# Patient Record
Sex: Male | Born: 1935 | Race: White | Hispanic: No | Marital: Married | Smoking: Never smoker
Health system: Southern US, Community
[De-identification: ages and names within clinical notes are randomized; demographics above are authoritative.]

## PROBLEM LIST (undated history)

## (undated) DIAGNOSIS — E079 Disorder of thyroid, unspecified: Secondary | ICD-10-CM

## (undated) DIAGNOSIS — F32A Depression, unspecified: Secondary | ICD-10-CM

## (undated) DIAGNOSIS — F419 Anxiety disorder, unspecified: Secondary | ICD-10-CM

## (undated) DIAGNOSIS — K219 Gastro-esophageal reflux disease without esophagitis: Secondary | ICD-10-CM

## (undated) DIAGNOSIS — N400 Enlarged prostate without lower urinary tract symptoms: Secondary | ICD-10-CM

## (undated) DIAGNOSIS — J449 Chronic obstructive pulmonary disease, unspecified: Secondary | ICD-10-CM

## (undated) DIAGNOSIS — F329 Major depressive disorder, single episode, unspecified: Secondary | ICD-10-CM

## (undated) HISTORY — DX: Chronic obstructive pulmonary disease, unspecified: J44.9

## (undated) HISTORY — DX: Benign prostatic hyperplasia without lower urinary tract symptoms: N40.0

## (undated) HISTORY — DX: Major depressive disorder, single episode, unspecified: F32.9

## (undated) HISTORY — DX: Disorder of thyroid, unspecified: E07.9

## (undated) HISTORY — PX: CHOLECYSTECTOMY: SHX55

## (undated) HISTORY — DX: Anxiety disorder, unspecified: F41.9

## (undated) HISTORY — DX: Gastro-esophageal reflux disease without esophagitis: K21.9

## (undated) HISTORY — PX: REPLACEMENT TOTAL KNEE: SUR1224

## (undated) HISTORY — DX: Depression, unspecified: F32.A

## (undated) HISTORY — PX: SHOULDER SURGERY: SHX246

---

## 2004-10-07 ENCOUNTER — Ambulatory Visit: Payer: Self-pay | Admitting: Family Medicine

## 2006-02-10 ENCOUNTER — Other Ambulatory Visit: Payer: Self-pay

## 2006-02-10 ENCOUNTER — Emergency Department: Payer: Self-pay | Admitting: Emergency Medicine

## 2006-02-20 ENCOUNTER — Ambulatory Visit: Payer: Self-pay | Admitting: Family Medicine

## 2006-04-10 ENCOUNTER — Inpatient Hospital Stay: Payer: Self-pay | Admitting: General Practice

## 2006-05-02 ENCOUNTER — Encounter: Payer: Self-pay | Admitting: General Practice

## 2006-12-31 ENCOUNTER — Encounter: Payer: Self-pay | Admitting: General Practice

## 2007-01-21 ENCOUNTER — Encounter: Payer: Self-pay | Admitting: General Practice

## 2009-08-17 ENCOUNTER — Ambulatory Visit: Payer: Self-pay | Admitting: General Practice

## 2009-08-25 ENCOUNTER — Ambulatory Visit: Payer: Self-pay | Admitting: Family Medicine

## 2009-08-30 ENCOUNTER — Inpatient Hospital Stay: Payer: Self-pay | Admitting: General Practice

## 2009-09-03 ENCOUNTER — Encounter: Payer: Self-pay | Admitting: Internal Medicine

## 2010-11-30 ENCOUNTER — Ambulatory Visit: Payer: Self-pay | Admitting: Family Medicine

## 2010-12-23 ENCOUNTER — Ambulatory Visit: Payer: Self-pay | Admitting: Internal Medicine

## 2011-12-07 ENCOUNTER — Ambulatory Visit: Payer: Self-pay | Admitting: Family Medicine

## 2013-05-12 ENCOUNTER — Emergency Department: Payer: Self-pay | Admitting: Emergency Medicine

## 2013-05-12 LAB — COMPREHENSIVE METABOLIC PANEL
Albumin: 3.7 g/dL (ref 3.4–5.0)
Alkaline Phosphatase: 51 U/L
Anion Gap: 6 — ABNORMAL LOW (ref 7–16)
BUN: 9 mg/dL (ref 7–18)
Bilirubin,Total: 0.6 mg/dL (ref 0.2–1.0)
Creatinine: 0.8 mg/dL (ref 0.60–1.30)
EGFR (Non-African Amer.): >60
Glucose: 119 mg/dL — ABNORMAL HIGH (ref 65–99)
Osmolality: 255 (ref 275–301)
SGPT (ALT): 25 U/L (ref 12–78)
Sodium: 127 mmol/L — ABNORMAL LOW (ref 136–145)

## 2013-05-12 LAB — CBC
HCT: 36.5 % — ABNORMAL LOW (ref 40.0–52.0)
HGB: 12.1 g/dL — ABNORMAL LOW (ref 13.0–18.0)
MCH: 30.6 pg (ref 26.0–34.0)
MCHC: 33 g/dL (ref 32.0–36.0)
Platelet: 153 10*3/uL (ref 150–440)
RBC: 3.94 10*6/uL — ABNORMAL LOW (ref 4.40–5.90)

## 2013-05-17 LAB — CULTURE, BLOOD (SINGLE)

## 2014-05-22 HISTORY — PX: COLONOSCOPY: SHX5424

## 2014-08-08 ENCOUNTER — Inpatient Hospital Stay: Payer: Self-pay | Admitting: Internal Medicine

## 2014-08-08 DIAGNOSIS — R0682 Tachypnea, not elsewhere classified: Secondary | ICD-10-CM | POA: Diagnosis not present

## 2014-08-08 DIAGNOSIS — R0902 Hypoxemia: Secondary | ICD-10-CM | POA: Diagnosis not present

## 2014-08-08 DIAGNOSIS — K269 Duodenal ulcer, unspecified as acute or chronic, without hemorrhage or perforation: Secondary | ICD-10-CM | POA: Diagnosis not present

## 2014-08-08 DIAGNOSIS — K920 Hematemesis: Secondary | ICD-10-CM | POA: Diagnosis not present

## 2014-08-08 DIAGNOSIS — K30 Functional dyspepsia: Secondary | ICD-10-CM | POA: Diagnosis not present

## 2014-08-08 DIAGNOSIS — J9601 Acute respiratory failure with hypoxia: Secondary | ICD-10-CM | POA: Diagnosis not present

## 2014-08-08 DIAGNOSIS — Z79891 Long term (current) use of opiate analgesic: Secondary | ICD-10-CM | POA: Diagnosis not present

## 2014-08-08 DIAGNOSIS — R4182 Altered mental status, unspecified: Secondary | ICD-10-CM | POA: Diagnosis not present

## 2014-08-08 DIAGNOSIS — Z885 Allergy status to narcotic agent status: Secondary | ICD-10-CM | POA: Diagnosis not present

## 2014-08-08 DIAGNOSIS — K640 First degree hemorrhoids: Secondary | ICD-10-CM | POA: Diagnosis not present

## 2014-08-08 DIAGNOSIS — Z96651 Presence of right artificial knee joint: Secondary | ICD-10-CM | POA: Diagnosis not present

## 2014-08-08 DIAGNOSIS — K921 Melena: Secondary | ICD-10-CM | POA: Diagnosis not present

## 2014-08-08 DIAGNOSIS — T39395A Adverse effect of other nonsteroidal anti-inflammatory drugs [NSAID], initial encounter: Secondary | ICD-10-CM | POA: Diagnosis not present

## 2014-08-08 DIAGNOSIS — Z791 Long term (current) use of non-steroidal anti-inflammatories (NSAID): Secondary | ICD-10-CM | POA: Diagnosis not present

## 2014-08-08 DIAGNOSIS — N400 Enlarged prostate without lower urinary tract symptoms: Secondary | ICD-10-CM | POA: Diagnosis not present

## 2014-08-08 DIAGNOSIS — R1010 Upper abdominal pain, unspecified: Secondary | ICD-10-CM | POA: Diagnosis not present

## 2014-08-08 DIAGNOSIS — E785 Hyperlipidemia, unspecified: Secondary | ICD-10-CM | POA: Diagnosis not present

## 2014-08-08 DIAGNOSIS — E039 Hypothyroidism, unspecified: Secondary | ICD-10-CM | POA: Diagnosis not present

## 2014-08-08 DIAGNOSIS — R0602 Shortness of breath: Secondary | ICD-10-CM | POA: Diagnosis not present

## 2014-08-08 DIAGNOSIS — Z96611 Presence of right artificial shoulder joint: Secondary | ICD-10-CM | POA: Diagnosis not present

## 2014-08-08 DIAGNOSIS — R1013 Epigastric pain: Secondary | ICD-10-CM | POA: Diagnosis not present

## 2014-08-08 DIAGNOSIS — K2970 Gastritis, unspecified, without bleeding: Secondary | ICD-10-CM | POA: Diagnosis not present

## 2014-08-08 DIAGNOSIS — M4800 Spinal stenosis, site unspecified: Secondary | ICD-10-CM | POA: Diagnosis not present

## 2014-08-08 DIAGNOSIS — K922 Gastrointestinal hemorrhage, unspecified: Secondary | ICD-10-CM | POA: Diagnosis not present

## 2014-08-08 DIAGNOSIS — I959 Hypotension, unspecified: Secondary | ICD-10-CM | POA: Diagnosis not present

## 2014-08-08 DIAGNOSIS — D649 Anemia, unspecified: Secondary | ICD-10-CM | POA: Diagnosis not present

## 2014-08-08 DIAGNOSIS — K2960 Other gastritis without bleeding: Secondary | ICD-10-CM | POA: Diagnosis not present

## 2014-08-08 DIAGNOSIS — Z7951 Long term (current) use of inhaled steroids: Secondary | ICD-10-CM | POA: Diagnosis not present

## 2014-08-08 DIAGNOSIS — M5136 Other intervertebral disc degeneration, lumbar region: Secondary | ICD-10-CM | POA: Diagnosis not present

## 2014-08-13 LAB — BASIC METABOLIC PANEL
ANION GAP: 8 (ref 7–16)
BUN: 10 mg/dL
CALCIUM: 8.2 mg/dL — AB
CO2: 33 mmol/L — AB
Chloride: 92 mmol/L — ABNORMAL LOW
Creatinine: 0.5 mg/dL — ABNORMAL LOW
EGFR (African American): >60
EGFR (Non-African Amer.): >60
GLUCOSE: 179 mg/dL — AB
Potassium: 3.4 mmol/L — ABNORMAL LOW
Sodium: 133 mmol/L — ABNORMAL LOW

## 2014-08-13 LAB — CBC WITH DIFFERENTIAL/PLATELET
BASOS PCT: 0.3 %
Basophil #: 0 10*3/uL (ref 0.0–0.1)
EOS PCT: 1.5 %
Eosinophil #: 0.1 10*3/uL (ref 0.0–0.7)
HCT: 25.7 % — ABNORMAL LOW (ref 40.0–52.0)
HGB: 8.3 g/dL — AB (ref 13.0–18.0)
LYMPHS ABS: 0.8 10*3/uL — AB (ref 1.0–3.6)
Lymphocyte %: 13.2 %
MCH: 30.8 pg (ref 26.0–34.0)
MCHC: 32.5 g/dL (ref 32.0–36.0)
MCV: 95 fL (ref 80–100)
MONOS PCT: 9.4 %
Monocyte #: 0.5 x10 3/mm (ref 0.2–1.0)
Neutrophil #: 4.4 10*3/uL (ref 1.4–6.5)
Neutrophil %: 75.6 %
Platelet: 181 10*3/uL (ref 150–440)
RBC: 2.71 10*6/uL — AB (ref 4.40–5.90)
RDW: 13.6 % (ref 11.5–14.5)
WBC: 5.8 10*3/uL (ref 3.8–10.6)

## 2014-08-18 DIAGNOSIS — J8410 Pulmonary fibrosis, unspecified: Secondary | ICD-10-CM | POA: Diagnosis not present

## 2014-08-18 DIAGNOSIS — K269 Duodenal ulcer, unspecified as acute or chronic, without hemorrhage or perforation: Secondary | ICD-10-CM | POA: Diagnosis not present

## 2014-08-18 DIAGNOSIS — M25561 Pain in right knee: Secondary | ICD-10-CM | POA: Diagnosis not present

## 2014-08-18 DIAGNOSIS — K2971 Gastritis, unspecified, with bleeding: Secondary | ICD-10-CM | POA: Diagnosis not present

## 2014-08-20 DIAGNOSIS — K269 Duodenal ulcer, unspecified as acute or chronic, without hemorrhage or perforation: Secondary | ICD-10-CM | POA: Diagnosis not present

## 2014-08-20 DIAGNOSIS — J8410 Pulmonary fibrosis, unspecified: Secondary | ICD-10-CM | POA: Diagnosis not present

## 2014-08-20 DIAGNOSIS — M25561 Pain in right knee: Secondary | ICD-10-CM | POA: Diagnosis not present

## 2014-08-20 DIAGNOSIS — K2971 Gastritis, unspecified, with bleeding: Secondary | ICD-10-CM | POA: Diagnosis not present

## 2014-08-24 DIAGNOSIS — K269 Duodenal ulcer, unspecified as acute or chronic, without hemorrhage or perforation: Secondary | ICD-10-CM | POA: Diagnosis not present

## 2014-08-24 DIAGNOSIS — M25561 Pain in right knee: Secondary | ICD-10-CM | POA: Diagnosis not present

## 2014-08-24 DIAGNOSIS — J8410 Pulmonary fibrosis, unspecified: Secondary | ICD-10-CM | POA: Diagnosis not present

## 2014-08-24 DIAGNOSIS — K2971 Gastritis, unspecified, with bleeding: Secondary | ICD-10-CM | POA: Diagnosis not present

## 2014-08-26 DIAGNOSIS — K2971 Gastritis, unspecified, with bleeding: Secondary | ICD-10-CM | POA: Diagnosis not present

## 2014-08-26 DIAGNOSIS — M25561 Pain in right knee: Secondary | ICD-10-CM | POA: Diagnosis not present

## 2014-08-26 DIAGNOSIS — J8410 Pulmonary fibrosis, unspecified: Secondary | ICD-10-CM | POA: Diagnosis not present

## 2014-08-26 DIAGNOSIS — K269 Duodenal ulcer, unspecified as acute or chronic, without hemorrhage or perforation: Secondary | ICD-10-CM | POA: Diagnosis not present

## 2014-08-28 DIAGNOSIS — F419 Anxiety disorder, unspecified: Secondary | ICD-10-CM | POA: Diagnosis not present

## 2014-08-28 DIAGNOSIS — K269 Duodenal ulcer, unspecified as acute or chronic, without hemorrhage or perforation: Secondary | ICD-10-CM | POA: Diagnosis not present

## 2014-08-28 DIAGNOSIS — J8410 Pulmonary fibrosis, unspecified: Secondary | ICD-10-CM | POA: Diagnosis not present

## 2014-08-28 DIAGNOSIS — F339 Major depressive disorder, recurrent, unspecified: Secondary | ICD-10-CM | POA: Diagnosis not present

## 2014-08-28 DIAGNOSIS — M25561 Pain in right knee: Secondary | ICD-10-CM | POA: Diagnosis not present

## 2014-08-28 DIAGNOSIS — E039 Hypothyroidism, unspecified: Secondary | ICD-10-CM | POA: Diagnosis not present

## 2014-08-28 DIAGNOSIS — K2971 Gastritis, unspecified, with bleeding: Secondary | ICD-10-CM | POA: Diagnosis not present

## 2014-09-01 DIAGNOSIS — K269 Duodenal ulcer, unspecified as acute or chronic, without hemorrhage or perforation: Secondary | ICD-10-CM | POA: Diagnosis not present

## 2014-09-01 DIAGNOSIS — K2971 Gastritis, unspecified, with bleeding: Secondary | ICD-10-CM | POA: Diagnosis not present

## 2014-09-01 DIAGNOSIS — M25561 Pain in right knee: Secondary | ICD-10-CM | POA: Diagnosis not present

## 2014-09-01 DIAGNOSIS — J8410 Pulmonary fibrosis, unspecified: Secondary | ICD-10-CM | POA: Diagnosis not present

## 2014-09-02 DIAGNOSIS — K269 Duodenal ulcer, unspecified as acute or chronic, without hemorrhage or perforation: Secondary | ICD-10-CM | POA: Diagnosis not present

## 2014-09-02 DIAGNOSIS — J8410 Pulmonary fibrosis, unspecified: Secondary | ICD-10-CM | POA: Diagnosis not present

## 2014-09-02 DIAGNOSIS — M25561 Pain in right knee: Secondary | ICD-10-CM | POA: Diagnosis not present

## 2014-09-02 DIAGNOSIS — K2971 Gastritis, unspecified, with bleeding: Secondary | ICD-10-CM | POA: Diagnosis not present

## 2014-09-03 DIAGNOSIS — M25561 Pain in right knee: Secondary | ICD-10-CM | POA: Diagnosis not present

## 2014-09-03 DIAGNOSIS — J8410 Pulmonary fibrosis, unspecified: Secondary | ICD-10-CM | POA: Diagnosis not present

## 2014-09-03 DIAGNOSIS — K2971 Gastritis, unspecified, with bleeding: Secondary | ICD-10-CM | POA: Diagnosis not present

## 2014-09-03 DIAGNOSIS — K269 Duodenal ulcer, unspecified as acute or chronic, without hemorrhage or perforation: Secondary | ICD-10-CM | POA: Diagnosis not present

## 2014-09-04 DIAGNOSIS — K2971 Gastritis, unspecified, with bleeding: Secondary | ICD-10-CM | POA: Diagnosis not present

## 2014-09-04 DIAGNOSIS — K269 Duodenal ulcer, unspecified as acute or chronic, without hemorrhage or perforation: Secondary | ICD-10-CM | POA: Diagnosis not present

## 2014-09-04 DIAGNOSIS — J8410 Pulmonary fibrosis, unspecified: Secondary | ICD-10-CM | POA: Diagnosis not present

## 2014-09-04 DIAGNOSIS — M25561 Pain in right knee: Secondary | ICD-10-CM | POA: Diagnosis not present

## 2014-09-07 DIAGNOSIS — K269 Duodenal ulcer, unspecified as acute or chronic, without hemorrhage or perforation: Secondary | ICD-10-CM | POA: Diagnosis not present

## 2014-09-07 DIAGNOSIS — J8410 Pulmonary fibrosis, unspecified: Secondary | ICD-10-CM | POA: Diagnosis not present

## 2014-09-07 DIAGNOSIS — K2971 Gastritis, unspecified, with bleeding: Secondary | ICD-10-CM | POA: Diagnosis not present

## 2014-09-07 DIAGNOSIS — M25561 Pain in right knee: Secondary | ICD-10-CM | POA: Diagnosis not present

## 2014-09-09 DIAGNOSIS — M25561 Pain in right knee: Secondary | ICD-10-CM | POA: Diagnosis not present

## 2014-09-09 DIAGNOSIS — K2971 Gastritis, unspecified, with bleeding: Secondary | ICD-10-CM | POA: Diagnosis not present

## 2014-09-09 DIAGNOSIS — K269 Duodenal ulcer, unspecified as acute or chronic, without hemorrhage or perforation: Secondary | ICD-10-CM | POA: Diagnosis not present

## 2014-09-09 DIAGNOSIS — J8410 Pulmonary fibrosis, unspecified: Secondary | ICD-10-CM | POA: Diagnosis not present

## 2014-09-10 DIAGNOSIS — J8410 Pulmonary fibrosis, unspecified: Secondary | ICD-10-CM | POA: Diagnosis not present

## 2014-09-10 DIAGNOSIS — K269 Duodenal ulcer, unspecified as acute or chronic, without hemorrhage or perforation: Secondary | ICD-10-CM | POA: Diagnosis not present

## 2014-09-10 DIAGNOSIS — M25561 Pain in right knee: Secondary | ICD-10-CM | POA: Diagnosis not present

## 2014-09-10 DIAGNOSIS — K2971 Gastritis, unspecified, with bleeding: Secondary | ICD-10-CM | POA: Diagnosis not present

## 2014-09-14 DIAGNOSIS — J9601 Acute respiratory failure with hypoxia: Secondary | ICD-10-CM | POA: Diagnosis not present

## 2014-09-14 DIAGNOSIS — J8410 Pulmonary fibrosis, unspecified: Secondary | ICD-10-CM | POA: Diagnosis not present

## 2014-09-14 DIAGNOSIS — M25561 Pain in right knee: Secondary | ICD-10-CM | POA: Diagnosis not present

## 2014-09-14 DIAGNOSIS — K269 Duodenal ulcer, unspecified as acute or chronic, without hemorrhage or perforation: Secondary | ICD-10-CM | POA: Diagnosis not present

## 2014-09-14 DIAGNOSIS — K2971 Gastritis, unspecified, with bleeding: Secondary | ICD-10-CM | POA: Diagnosis not present

## 2014-09-16 DIAGNOSIS — K2971 Gastritis, unspecified, with bleeding: Secondary | ICD-10-CM | POA: Diagnosis not present

## 2014-09-16 DIAGNOSIS — K269 Duodenal ulcer, unspecified as acute or chronic, without hemorrhage or perforation: Secondary | ICD-10-CM | POA: Diagnosis not present

## 2014-09-16 DIAGNOSIS — M25561 Pain in right knee: Secondary | ICD-10-CM | POA: Diagnosis not present

## 2014-09-16 DIAGNOSIS — J8410 Pulmonary fibrosis, unspecified: Secondary | ICD-10-CM | POA: Diagnosis not present

## 2014-09-17 ENCOUNTER — Ambulatory Visit
Admit: 2014-09-17 | Disposition: A | Discharge status: Home / Self Care | Payer: Self-pay | Attending: Family Medicine | Admitting: Family Medicine

## 2014-09-17 DIAGNOSIS — R5381 Other malaise: Secondary | ICD-10-CM | POA: Diagnosis not present

## 2014-09-17 DIAGNOSIS — R0602 Shortness of breath: Secondary | ICD-10-CM | POA: Diagnosis not present

## 2014-09-17 DIAGNOSIS — R5383 Other fatigue: Secondary | ICD-10-CM | POA: Diagnosis not present

## 2014-09-18 DIAGNOSIS — J8410 Pulmonary fibrosis, unspecified: Secondary | ICD-10-CM | POA: Diagnosis not present

## 2014-09-18 DIAGNOSIS — K2971 Gastritis, unspecified, with bleeding: Secondary | ICD-10-CM | POA: Diagnosis not present

## 2014-09-18 DIAGNOSIS — M25561 Pain in right knee: Secondary | ICD-10-CM | POA: Diagnosis not present

## 2014-09-18 DIAGNOSIS — K269 Duodenal ulcer, unspecified as acute or chronic, without hemorrhage or perforation: Secondary | ICD-10-CM | POA: Diagnosis not present

## 2014-09-20 NOTE — Consult Note (Signed)
Flex sig to 50 cm showed only dark blood, some marroon in distal rectum, internal hemorrhoids.  Will continue full liquid diet and recheck labs today.  Electronic Signatures: Scot JunElliott, Robert T (MD)  (Signed on 24-Mar-16 11:19)  Authored  Last Updated: 24-Mar-16 11:19 by Scot JunElliott, Robert T (MD)

## 2014-09-20 NOTE — Consult Note (Signed)
Pt with BRBPR per nurse.  No fall in hgb. He may have colonic bleeding from tumor, hemorrhoids, AVM, ischemia.  I am reluctant to do a colon prep now at this moment due to the ulcer in the duodenum.  If his CT does not show anything like a mass and his hgb is stable    will likely do a flexible sigmoid given the BRBPR and can clean out for this without disturbing the ulcer.  Then would come back in 3-4 weeks and do a colonoscopy if it was neg and no further bleeding.  Electronic Signatures: Scot JunElliott, Andrey Hoobler T (MD)  (Signed on 22-Mar-16 17:29)  Authored  Last Updated: 22-Mar-16 17:29 by Scot JunElliott, Leatta Alewine T (MD)

## 2014-09-20 NOTE — Consult Note (Signed)
PATIENT NAME:  Jamie Fischer, [name] MR#:  [id number] DATE OF BIRTH:  February 15, 1920  DATE OF CONSULTATION:  08/09/2014  CONSULTING PHYSICIAN:  Jamie Junobert T. Elliott, MD  HISTORY OF PRESENT ILLNESS: The patient is a 81 year old, white male who was admitted for GI bleeding. He had been taking Advil 200 mg 2 tablets every 6 hours p.r.n. for pain and he was noted to have indigestion, upper abdominal pain, vomited darkish material, had maroon stool x 3 on the day of admission yesterday, as "quite a lot of blood." He also had a syncopal episode before admission. He was admitted for GI bleeding. I was asked to see him in consultation.   The patient's hemoglobin at admission was 8.9. His BUN was also elevated at 45, indicating a significant amount of blood in the GI tract.   PAST MEDICAL HISTORY: Hypothyroidism, BPH, hyperlipidemia.   PAST SURGICAL HISTORY: Gallbladder surgery, right knee replacement, right shoulder replacement.   ALLERGIES: CODEINE.   MEDICATIONS: Advil 200 mg 2 tablets every 6 hours p.r.n. for pain, Xanax 0.25 mg once a day at bedtime, Flomax 0.4 mg daily, hydrochlorothiazide 25 mg a day, levothyroxine 50 mcg daily, ranitidine 150 mg daily, simvastatin 40 mg at bedtime and Xopenex nebulizer 1.25 mg 3 times a day as needed for shortness of breath.   SOCIAL HISTORY: Does not smoke. Once a month, he drinks alcohol. Has worked as a Visual merchandiserfarmer. Was in the [employer] in [employer].   FAMILY HISTORY: Mother died at the age of 65103 of old age. Father died at age 79 of infection.   REVIEW OF SYSTEMS: There is some dysphagia. There is some chest pain and upper abdominal pain and some shortness of breath,, nausea, vomiting and dark stools and a syncopal spell earlier yesterday.   PHYSICAL EXAMINATION: GENERAL: A pleasant, appropriate, elderly, white male in no acute distress.  VITAL SIGNS: Temperature is 98.7, pulse 109, respirations 29, blood pressure 107/53, O2 saturation 99% on 2 liters.  HEENT: Sclerae  anicteric. Conjunctivae slightly pale.  HEAD: Atraumatic. Tongue is slightly pale.  CHEST: Clear, anterior fields.  HEART: Shows no murmurs or gallops I can hear.  ABDOMEN: No hepatosplenomegaly. No masses. No bruits. Soft, nontender.  SKIN: Warm and dry.   LABORATORY DATA: Glucose 126, BUN 37 down from 45, creatinine 0.62. Sodium 130, potassium 4.3, chloride 94. Troponin negative x 3. Albumin 2.7. Hemoglobin is 8.3 at 2:30 earlier this morning, white count 6.5, platelet count 194,000. He has A positive blood with a negative antibody screen.   Chest x-ray showed bibasilar atelectasis.   ASSESSMENT: Upper gastrointestinal bleeding secondary to nonsteroidal use. Likely his bleeding has stopped.   RECOMMEND: That he have only water today and then clear liquids tomorrow. Likely, he has an ulcer with a clot on it and I do not want regular food to knock off his clot. At this time, in the absence of signs of active continued bleeding, we will hold off on endoscopy. I am also going to override guidelines for transfusion because of his age, because of his syncopal episode before admission and give him a unit of blood slowly over 4 hours. Continue IV medications, shut off acid in the stomach. He is on a Protonix drip. We will stop any unnecessary oral medication at this time and will suspend his vitamins and his Zocor for now, again for concern of not aggravating his clot on likely ulcer. We will follow with you.    ____________________________ Jamie Junobert T. Elliott, MD rte:TT D:  08/09/2014 11:06:05 ET T: 08/09/2014 11:17:58 ET JOB#: [id number]  cc: Jamie Jun, MD, <Dictator> Duanne Limerick, MD Jamie Jun MD ELECTRONICALLY SIGNED 09/05/2014 16:02

## 2014-09-20 NOTE — Consult Note (Signed)
Pt with large duodenal ulcer with clean base in duodenal bulb.  Low chance for rebleeding. Will start clear liquids now and advance to full liquids as tolerated.  Change iv drip to iv bolus and in 2 days to pills.  Electronic Signatures: Scot JunElliott, Amberleigh Gerken T (MD)  (Signed on 21-Mar-16 16:52)  Authored  Last Updated: 21-Mar-16 16:52 by Scot JunElliott, Marketta Valadez T (MD)

## 2014-09-20 NOTE — Consult Note (Signed)
CBC and met b values suggest no active bleeding into the GI tract at this time.  Could advance diet to mechanical soft tomorrow and discharge if hgb still ok.  Electronic Signatures: Manya Silvas (MD)  (Signed on 24-Mar-16 14:59)  Authored  Last Updated: 24-Mar-16 14:59 by Manya Silvas (MD)

## 2014-09-20 NOTE — H&P (Signed)
PATIENT NAME:  Jamie Fischer, Jamie Fischer MR#:  [id number] DATE OF BIRTH:  Nov 20, 1919  DATE OF ADMISSION:  08/08/2014  PRIMARY CARE PHYSICIAN: Duanne Limerick, MD  CHIEF COMPLAINT: Blood in the stool, sick to the stomach, and low blood pressure.   HISTORY OF PRESENT ILLNESS: This is a 81 year old man who has been having some indigestion and taking some antacids for awhile, having some upper abdominal pain. He had dark maroon stools today 3 times, quite a bit of blood. Had a low blood pressure. Had some vomiting, darkish material. As per family, he was a lot less alert before. He is weak, unable to walk, possible passing out earlier. He did have a colonoscopy about 12 to 15 years ago that was okay, as per the patient. In the ER, he had initial hypotension and hemoglobin was found to be 8.9, and hospitalist services were contacted for further evaluation.   PAST MEDICAL HISTORY: BPH, hypothyroidism, anxiety, hyperlipidemia.   PAST SURGICAL HISTORY: Right shoulder replacement, right knee replacement, gallbladder surgery.   ALLERGIES: CODEINE.   MEDICATIONS: That the patient takes as an outpatient include: Advil 200 mg 2 tablets every 6 hours as needed for pain, Xanax 0.25 mg once a day at bedtime, Centrum Silver 1 tablet daily, Flomax 0.4 mg daily, hydrochlorothiazide 25 mg twice a day, levothyroxine 50 mcg daily, ranitidine 150 mg daily, simvastatin 40 mg at bedtime, Xopenex 1.25 mg nebulizer 3 times a day as needed for shortness of breath.   SOCIAL HISTORY: No smoking. No drug use. Once a month he does drink alcohol. 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 He was in the [employer] He lives with his wife.   FAMILY HISTORY: Father died at 94 of infection. Mother died at 55 of old age.  REVIEW OF SYSTEMS:  CONSTITUTIONAL: Positive for fatigue. No fever, chills, or sweats.  EYES: He does wear glasses, has some blurred vision. EARS, NOSE, MOUTH AND THROAT: Positive for  runny nose, positive for dysphagia to solids.  CARDIOVASCULAR: Positive for chest pain and upper abdominal pain. No palpitation.  RESPIRATORY: Positive for shortness of breath. No cough. No sputum. No hemoptysis.  GASTROINTESTINAL: Positive for nausea. Positive for vomiting brown material. Positive for epigastric abdominal pain. Positive for maroon stools.  GENITOURINARY: No burning on urination or hematuria.  MUSCULOSKELETAL: Positive for joint pains.  INTEGUMENT: No rashes or eruptions.  NEUROLOGIC: Positive for passing out earlier today.  ENDOCRINE: Positive for hypothyroidism. HEMATOLOGIC AND LYMPHATIC: History of anemia.   PHYSICAL EXAMINATION:  VITAL SIGNS: On presentation to the ER included a temperature of 97.5, pulse 100, respirations 31, blood pressure 103/63, pulse oximetry 94% on room air.  GENERAL: Slight respiratory distress, using accessory muscles.  EYES: Conjunctivae and lids normal. Pupils equal, round, and reactive to light. Extraocular muscles intact. No nystagmus. EARS, NOSE, MOUTH, AND THROAT: Tympanic membrane: Blocked with wax. Nasal mucosa: No erythema. Throat: No erythema. No exudate seen. Lips and gums: No lesions.  NECK: No JVD. No bruits. No lymphadenopathy. No thyromegaly. No thyroid nodules palpated.  RESPIRATORY: Positive use of accessory muscles to breathe. No rhonchi, rales, or wheeze heard.  CARDIOVASCULAR: S1, S2 tachycardic. No gallops, rubs, or murmurs heard. Carotid upstroke 2+ bilaterally. No bruits. Dorsalis pedis pulses 2+ bilaterally. No edema of the lower extremities.  ABDOMEN: Soft. Positive tenderness in the epigastric area. No organomegaly/splenomegaly. Normoactive bowel sounds. No masses felt.  LYMPHATIC: No lymph nodes in the neck.  MUSCULOSKELETAL: No clubbing,  edema, or cyanosis.  SKIN: No rashes or ulcers seen.  NEUROLOGIC: Cranial nerves II through XII grossly intact. Deep tendon reflexes 2+ bilateral lower extremities.  PSYCHIATRIC: The  patient is oriented to person, place, and time.   LABORATORY AND RADIOLOGICAL DATA: Troponin negative. White blood cell count 6.5, H and H 8.9 and 26.8, platelet count of 194,000. Glucose 179, BUN 45, creatinine 0.81, sodium 131, potassium 4.4, chloride 92, CO2 of 32, calcium 7.9. Liver function tests: Alkaline phosphatase 24, ALT 9, AST 24, albumin 2.7.   EKG: Sinus tachycardia at 102 beats per minute, left axis deviation, inferior Qs.   ASSESSMENT AND PLAN:  1.  Gastrointestinal bleed with epigastric abdominal pain and maroon stools secondary to nonsteroidal anti-inflammatory drug use. The patient initially had hypotension and altered mental status; that is better at this point with fluid boluses given in the Emergency Room. I will start on a Protonix bolus and drip. Clear liquid now and n.p.o. after midnight. Gastroenterology consultation in the a.m. Patient was consented for blood in the Emergency Room, since hemoglobin is 8.9 at this point. We will check it again. If the patient bleeds further, will have to transfuse blood.  2.  Hypothyroidism, unspecified. Continue levothyroxine.  3.  Benign prostatic hypertrophy without urinary symptoms. Continue Flomax.  4.  Hyperlipidemia, unspecified. Continue statin.  5.  Tachypnea. Will order a chest x-ray for further evaluation. Could be secondary to the gastrointestinal bleed. We will continue to monitor closely.   The patient will be admitted to the CCU step-down for further monitoring. Close clinical monitoring needed at this point.  TIME SPENT ON ADMISSION: 55 minutes.  CODE STATUS: The patient is a full code.    ____________________________ Herschell Dimesichard J. Renae GlossWieting, MD rjw:ST D: 08/08/2014 20:58:00 ET T: 08/08/2014 22:35:03 ET JOB#: [id number]  cc: Herschell Dimesichard J. Renae GlossWieting, MD, <Dictator> Duanne Limerickeanna C. Jones, MD Salley ScarletICHARD J Alexzandrea Normington MD ELECTRONICALLY SIGNED 08/09/2014 13:32

## 2014-09-20 NOTE — Discharge Summary (Signed)
PATIENT NAME:  Jamie Fischer, SCHAAB MR#:  [id number] DATE OF BIRTH:  1919-09-02  DATE OF ADMISSION:  08/08/2014 DATE OF DISCHARGE:  08/14/2014  DISCHARGE DIAGNOSIS: Gastrointestinal bleed with epigastric abdominal pain and maroon-colored stool secondary to nonsteroidal anti-inflammatory drug use, status post 2 units of packed cell transfusion.   SECONDARY DIAGNOSES:  BPH, hypothyroidism, anxiety, hyperlipidemia.   CONSULTATIONS: GI, Dr. Lynnae Prude.   PROCEDURES AND RADIOLOGY:  1.  EGD on the 21st of March by Dr. Mechele Collin showed large duodenal ulcer with clean base in the duodenal bulb.   2.  Flexible sigmoidoscopy on the 24th of March showed dark blood, some maroon-colored in the distal rectum, internal hemorrhoids seen.  3.  Chest x-ray on the 19th of March showed no acute cardiopulmonary disease.  4.  CT scan of the abdomen and pelvis with contrast on the 22nd of March showed no definite GI bleeding explanation seen.  5.  Severe diffuse lumbar degenerative disk disease with dextroscoliosis. Advanced multilevel foraminal stenosis seen.   HISTORY AND SHORT HOSPITAL COURSE: The patient is a 81 year old male with above-mentioned medical problems who was admitted for blood in the stool and some abdominal pain, was concerning for acute GI bleed for which GI consultation was obtained. Please see Dr. Jetta Lout dictated history and physical for further details. The patient was using nonsteroidals which were thought to be contributing to his illness. GI, Dr. Mechele Collin, recommended EGD which was performed on the 21st of March showing duodenal ulcer, which was likely to be from nonsteroidal anti-inflammatory medication. The patient was slowly started on diet and was tolerating fine, but was still having lower abdominal pain for which he underwent flexible sigmoidoscopy which showed only dark colored blood and some marron colon in the distal rectum along with internal hemorrhoids. No other lesions were  seen. The patient was slowly started back on the diet and tolerated it fine. His hemoglobin did remain stable. He was evaluated by physical therapy because of his weakness and was recommended short-term rehabilitation placement or skilled nursing facility, although the patient refused both and remained adamant just to go home. He was also checked for oxygen level and his oxygen saturations dropped to 85% on minimal exertion just by standing up, for which he was set up to get 2 liters oxygen via nasal cannula. This could be certainly due to atelectasis or pulmonary fibrosis. The patient is being discharged home as his hemoglobin and hematocrit have been stable and no further bleeding has been noted.   On the date of discharge, his vital signs were as follows: Temperature 98, heart rate 88 per minute, respirations 18 per minute, blood pressure 120/67. He is saturating 93% on 1 liter and 85% with exertion on room air and will 2 liters oxygen, especially on exertion or at bedtime.   PERTINENT PHYSICAL EXAMINATION ON THE DATE OF DISCHARGE:  CARDIOVASCULAR: S1, S2 normal. No murmurs, rubs, or gallop.  LUNGS: Clear to auscultation bilaterally. No wheezing, rales, rhonchi, or crepitation.  ABDOMEN: Soft, benign.  NEUROLOGIC: Nonfocal examination. All other physical examination remained at baseline.   DISCHARGE MEDICATIONS:  Medication Instructions  flomax 0.4 mg oral capsule  1 cap(s) orally once a day   simvastatin 40 mg oral tablet  1 tab(s) orally once a day (at bedtime)   xopenex 1.25 mg/3 ml inhalation solution  1 vial (3 milliliter(s) via nebulizer 3 times a day as needed for shortness of breath.   hydrochlorothiazide 25 mg oral tablet  1 tab(s) orally  2 times a day   levothyroxine 50 mcg (0.05 mg) oral tablet  1 tab(s) orally once a day   alprazolam 0.25 mg oral tablet  1 tab(s) orally once a day (at bedtime)   centrum silver therapeutic multiple vitamins with minerals oral tablet  1 tab(s) orally  once a day   protonix 40 mg oral delayed release tablet  1 tab(s) orally 2 times a day   diclofenac 1% topical gel  Apply topically to affected area 2 times a day    DISCHARGE DIET: Soft diet and advance as tolerated to regular diet.   DISCHARGE ACTIVITY: As tolerated.   DISCHARGE INSTRUCTIONS AND FOLLOWUP: The patient was instructed to follow up with his primary care physician, Dr. Elizabeth Sauereanna Jones, in 1 to 2 weeks. [facility] and [facility], Dr.  Lynnae Prudeobert Elliott, in 4 to 6 weeks. He was set up to get home health, physical therapy nurse, and Child psychotherapistsocial worker. He will need 2 liters oxygen via nasal cannula on ambulation and at night for now.   TOTAL TIME DISCHARGING THIS PATIENT: 45 minutes.   He remains at very high risk for readmission.    ____________________________ Ellamae SiaVipul S. Sherryll BurgerShah, MD vss:at D: 08/14/2014 18:00:59 ET T: 08/14/2014 18:26:34 ET JOB#: [id number]  cc: Kyrene Longan S. Sherryll BurgerShah, MD, <Dictator> Duanne Limerickeanna C. Jones, MD Scot Junobert T. Elliott, MD Ellamae SiaVIPUL S [facility] L[facility] MD ELECTRONICALLY SIGNED 08/17/2014 8:09

## 2014-09-21 DIAGNOSIS — K2971 Gastritis, unspecified, with bleeding: Secondary | ICD-10-CM | POA: Diagnosis not present

## 2014-09-21 DIAGNOSIS — K269 Duodenal ulcer, unspecified as acute or chronic, without hemorrhage or perforation: Secondary | ICD-10-CM | POA: Diagnosis not present

## 2014-09-21 DIAGNOSIS — J8410 Pulmonary fibrosis, unspecified: Secondary | ICD-10-CM | POA: Diagnosis not present

## 2014-09-21 DIAGNOSIS — M25561 Pain in right knee: Secondary | ICD-10-CM | POA: Diagnosis not present

## 2014-09-22 DIAGNOSIS — J8410 Pulmonary fibrosis, unspecified: Secondary | ICD-10-CM | POA: Diagnosis not present

## 2014-09-22 DIAGNOSIS — K2971 Gastritis, unspecified, with bleeding: Secondary | ICD-10-CM | POA: Diagnosis not present

## 2014-09-22 DIAGNOSIS — K269 Duodenal ulcer, unspecified as acute or chronic, without hemorrhage or perforation: Secondary | ICD-10-CM | POA: Diagnosis not present

## 2014-09-22 DIAGNOSIS — M25561 Pain in right knee: Secondary | ICD-10-CM | POA: Diagnosis not present

## 2014-09-23 DIAGNOSIS — K269 Duodenal ulcer, unspecified as acute or chronic, without hemorrhage or perforation: Secondary | ICD-10-CM | POA: Diagnosis not present

## 2014-09-23 DIAGNOSIS — M25561 Pain in right knee: Secondary | ICD-10-CM | POA: Diagnosis not present

## 2014-09-23 DIAGNOSIS — J8410 Pulmonary fibrosis, unspecified: Secondary | ICD-10-CM | POA: Diagnosis not present

## 2014-09-23 DIAGNOSIS — K2971 Gastritis, unspecified, with bleeding: Secondary | ICD-10-CM | POA: Diagnosis not present

## 2014-09-24 DIAGNOSIS — M25561 Pain in right knee: Secondary | ICD-10-CM | POA: Diagnosis not present

## 2014-09-24 DIAGNOSIS — K2971 Gastritis, unspecified, with bleeding: Secondary | ICD-10-CM | POA: Diagnosis not present

## 2014-09-24 DIAGNOSIS — J8410 Pulmonary fibrosis, unspecified: Secondary | ICD-10-CM | POA: Diagnosis not present

## 2014-09-24 DIAGNOSIS — K269 Duodenal ulcer, unspecified as acute or chronic, without hemorrhage or perforation: Secondary | ICD-10-CM | POA: Diagnosis not present

## 2014-09-25 DIAGNOSIS — K269 Duodenal ulcer, unspecified as acute or chronic, without hemorrhage or perforation: Secondary | ICD-10-CM | POA: Diagnosis not present

## 2014-09-25 DIAGNOSIS — J8410 Pulmonary fibrosis, unspecified: Secondary | ICD-10-CM | POA: Diagnosis not present

## 2014-09-25 DIAGNOSIS — M25561 Pain in right knee: Secondary | ICD-10-CM | POA: Diagnosis not present

## 2014-09-25 DIAGNOSIS — K2971 Gastritis, unspecified, with bleeding: Secondary | ICD-10-CM | POA: Diagnosis not present

## 2014-09-28 DIAGNOSIS — R5381 Other malaise: Secondary | ICD-10-CM | POA: Diagnosis not present

## 2014-09-28 DIAGNOSIS — R5383 Other fatigue: Secondary | ICD-10-CM | POA: Diagnosis not present

## 2014-09-28 DIAGNOSIS — E871 Hypo-osmolality and hyponatremia: Secondary | ICD-10-CM | POA: Diagnosis not present

## 2014-09-28 DIAGNOSIS — D649 Anemia, unspecified: Secondary | ICD-10-CM | POA: Diagnosis not present

## 2014-09-29 DIAGNOSIS — K2971 Gastritis, unspecified, with bleeding: Secondary | ICD-10-CM | POA: Diagnosis not present

## 2014-09-29 DIAGNOSIS — M25561 Pain in right knee: Secondary | ICD-10-CM | POA: Diagnosis not present

## 2014-09-29 DIAGNOSIS — J8410 Pulmonary fibrosis, unspecified: Secondary | ICD-10-CM | POA: Diagnosis not present

## 2014-09-29 DIAGNOSIS — K269 Duodenal ulcer, unspecified as acute or chronic, without hemorrhage or perforation: Secondary | ICD-10-CM | POA: Diagnosis not present

## 2014-09-30 DIAGNOSIS — K269 Duodenal ulcer, unspecified as acute or chronic, without hemorrhage or perforation: Secondary | ICD-10-CM | POA: Diagnosis not present

## 2014-09-30 DIAGNOSIS — M25561 Pain in right knee: Secondary | ICD-10-CM | POA: Diagnosis not present

## 2014-09-30 DIAGNOSIS — K2971 Gastritis, unspecified, with bleeding: Secondary | ICD-10-CM | POA: Diagnosis not present

## 2014-09-30 DIAGNOSIS — J8410 Pulmonary fibrosis, unspecified: Secondary | ICD-10-CM | POA: Diagnosis not present

## 2014-10-02 DIAGNOSIS — M25561 Pain in right knee: Secondary | ICD-10-CM | POA: Diagnosis not present

## 2014-10-02 DIAGNOSIS — K2971 Gastritis, unspecified, with bleeding: Secondary | ICD-10-CM | POA: Diagnosis not present

## 2014-10-02 DIAGNOSIS — J8410 Pulmonary fibrosis, unspecified: Secondary | ICD-10-CM | POA: Diagnosis not present

## 2014-10-02 DIAGNOSIS — K269 Duodenal ulcer, unspecified as acute or chronic, without hemorrhage or perforation: Secondary | ICD-10-CM | POA: Diagnosis not present

## 2014-10-05 DIAGNOSIS — M25561 Pain in right knee: Secondary | ICD-10-CM | POA: Diagnosis not present

## 2014-10-05 DIAGNOSIS — K2971 Gastritis, unspecified, with bleeding: Secondary | ICD-10-CM | POA: Diagnosis not present

## 2014-10-05 DIAGNOSIS — K269 Duodenal ulcer, unspecified as acute or chronic, without hemorrhage or perforation: Secondary | ICD-10-CM | POA: Diagnosis not present

## 2014-10-05 DIAGNOSIS — J8410 Pulmonary fibrosis, unspecified: Secondary | ICD-10-CM | POA: Diagnosis not present

## 2014-10-06 DIAGNOSIS — M25561 Pain in right knee: Secondary | ICD-10-CM | POA: Diagnosis not present

## 2014-10-06 DIAGNOSIS — J8410 Pulmonary fibrosis, unspecified: Secondary | ICD-10-CM | POA: Diagnosis not present

## 2014-10-06 DIAGNOSIS — K2971 Gastritis, unspecified, with bleeding: Secondary | ICD-10-CM | POA: Diagnosis not present

## 2014-10-06 DIAGNOSIS — K269 Duodenal ulcer, unspecified as acute or chronic, without hemorrhage or perforation: Secondary | ICD-10-CM | POA: Diagnosis not present

## 2014-10-07 DIAGNOSIS — M25561 Pain in right knee: Secondary | ICD-10-CM | POA: Diagnosis not present

## 2014-10-07 DIAGNOSIS — J8410 Pulmonary fibrosis, unspecified: Secondary | ICD-10-CM | POA: Diagnosis not present

## 2014-10-07 DIAGNOSIS — K269 Duodenal ulcer, unspecified as acute or chronic, without hemorrhage or perforation: Secondary | ICD-10-CM | POA: Diagnosis not present

## 2014-10-07 DIAGNOSIS — K2971 Gastritis, unspecified, with bleeding: Secondary | ICD-10-CM | POA: Diagnosis not present

## 2014-10-08 DIAGNOSIS — M25561 Pain in right knee: Secondary | ICD-10-CM | POA: Diagnosis not present

## 2014-10-08 DIAGNOSIS — K269 Duodenal ulcer, unspecified as acute or chronic, without hemorrhage or perforation: Secondary | ICD-10-CM | POA: Diagnosis not present

## 2014-10-08 DIAGNOSIS — K2971 Gastritis, unspecified, with bleeding: Secondary | ICD-10-CM | POA: Diagnosis not present

## 2014-10-08 DIAGNOSIS — E871 Hypo-osmolality and hyponatremia: Secondary | ICD-10-CM | POA: Diagnosis not present

## 2014-10-08 DIAGNOSIS — J8410 Pulmonary fibrosis, unspecified: Secondary | ICD-10-CM | POA: Diagnosis not present

## 2014-10-12 DIAGNOSIS — K269 Duodenal ulcer, unspecified as acute or chronic, without hemorrhage or perforation: Secondary | ICD-10-CM | POA: Diagnosis not present

## 2014-10-12 DIAGNOSIS — M25561 Pain in right knee: Secondary | ICD-10-CM | POA: Diagnosis not present

## 2014-10-12 DIAGNOSIS — K2971 Gastritis, unspecified, with bleeding: Secondary | ICD-10-CM | POA: Diagnosis not present

## 2014-10-12 DIAGNOSIS — J8410 Pulmonary fibrosis, unspecified: Secondary | ICD-10-CM | POA: Diagnosis not present

## 2014-10-12 DIAGNOSIS — K5900 Constipation, unspecified: Secondary | ICD-10-CM | POA: Diagnosis not present

## 2014-10-14 DIAGNOSIS — K269 Duodenal ulcer, unspecified as acute or chronic, without hemorrhage or perforation: Secondary | ICD-10-CM | POA: Diagnosis not present

## 2014-10-14 DIAGNOSIS — J9601 Acute respiratory failure with hypoxia: Secondary | ICD-10-CM | POA: Diagnosis not present

## 2014-10-14 DIAGNOSIS — K2971 Gastritis, unspecified, with bleeding: Secondary | ICD-10-CM | POA: Diagnosis not present

## 2014-10-14 DIAGNOSIS — J8410 Pulmonary fibrosis, unspecified: Secondary | ICD-10-CM | POA: Diagnosis not present

## 2014-10-14 DIAGNOSIS — M25561 Pain in right knee: Secondary | ICD-10-CM | POA: Diagnosis not present

## 2014-10-23 ENCOUNTER — Ambulatory Visit: Payer: Self-pay | Admitting: Family Medicine

## 2014-10-26 DIAGNOSIS — N410 Acute prostatitis: Secondary | ICD-10-CM | POA: Diagnosis not present

## 2014-10-26 DIAGNOSIS — N400 Enlarged prostate without lower urinary tract symptoms: Secondary | ICD-10-CM | POA: Diagnosis not present

## 2014-10-26 DIAGNOSIS — R109 Unspecified abdominal pain: Secondary | ICD-10-CM | POA: Diagnosis not present

## 2014-10-26 DIAGNOSIS — Z885 Allergy status to narcotic agent status: Secondary | ICD-10-CM | POA: Diagnosis not present

## 2014-10-26 DIAGNOSIS — I252 Old myocardial infarction: Secondary | ICD-10-CM | POA: Diagnosis not present

## 2014-10-26 DIAGNOSIS — Z87891 Personal history of nicotine dependence: Secondary | ICD-10-CM | POA: Diagnosis not present

## 2014-10-26 DIAGNOSIS — R599 Enlarged lymph nodes, unspecified: Secondary | ICD-10-CM | POA: Diagnosis not present

## 2014-10-26 DIAGNOSIS — K644 Residual hemorrhoidal skin tags: Secondary | ICD-10-CM | POA: Diagnosis not present

## 2014-11-11 DIAGNOSIS — L723 Sebaceous cyst: Secondary | ICD-10-CM | POA: Diagnosis not present

## 2014-11-11 DIAGNOSIS — Z87891 Personal history of nicotine dependence: Secondary | ICD-10-CM | POA: Diagnosis not present

## 2014-11-11 DIAGNOSIS — I252 Old myocardial infarction: Secondary | ICD-10-CM | POA: Diagnosis not present

## 2014-11-14 DIAGNOSIS — J9601 Acute respiratory failure with hypoxia: Secondary | ICD-10-CM | POA: Diagnosis not present

## 2014-12-14 DIAGNOSIS — J9601 Acute respiratory failure with hypoxia: Secondary | ICD-10-CM | POA: Diagnosis not present

## 2014-12-23 LAB — CBC WITH DIFFERENTIAL/PLATELET
BASOS ABS: 0 10*3/uL (ref 0.0–0.1)
Basophil %: 0.3 %
Eosinophil #: 0 10*3/uL (ref 0.0–0.7)
Eosinophil %: 0.7 %
HCT: 25.5 % — ABNORMAL LOW (ref 40.0–52.0)
HGB: 8.3 g/dL — ABNORMAL LOW (ref 13.0–18.0)
Lymphocyte #: 0.9 10*3/uL — ABNORMAL LOW (ref 1.0–3.6)
Lymphocyte %: 14.3 %
MCH: 30.8 pg (ref 26.0–34.0)
MCHC: 32.4 g/dL (ref 32.0–36.0)
MCV: 95 fL (ref 80–100)
MONO ABS: 0.6 x10 3/mm (ref 0.2–1.0)
MONOS PCT: 10.8 %
NEUTROS ABS: 4.5 10*3/uL (ref 1.4–6.5)
Neutrophil %: 73.9 %
Platelet: 156 10*3/uL (ref 150–440)
RBC: 2.68 10*6/uL — ABNORMAL LOW (ref 4.40–5.90)
RDW: 14.2 % (ref 11.5–14.5)
WBC: 6 10*3/uL (ref 3.8–10.6)

## 2015-01-14 DIAGNOSIS — J9601 Acute respiratory failure with hypoxia: Secondary | ICD-10-CM | POA: Diagnosis not present

## 2015-01-15 ENCOUNTER — Other Ambulatory Visit: Payer: Self-pay | Admitting: Family Medicine

## 2015-01-15 DIAGNOSIS — N400 Enlarged prostate without lower urinary tract symptoms: Secondary | ICD-10-CM

## 2015-02-08 ENCOUNTER — Other Ambulatory Visit: Payer: Self-pay | Admitting: Family Medicine

## 2015-02-14 DIAGNOSIS — J9601 Acute respiratory failure with hypoxia: Secondary | ICD-10-CM | POA: Diagnosis not present

## 2015-02-15 ENCOUNTER — Encounter: Payer: Self-pay | Admitting: Family Medicine

## 2015-02-15 ENCOUNTER — Ambulatory Visit (INDEPENDENT_AMBULATORY_CARE_PROVIDER_SITE_OTHER): Payer: Medicare Other | Admitting: Family Medicine

## 2015-02-15 VITALS — BP 120/80 | HR 64 | Ht 65.0 in | Wt 175.0 lb

## 2015-02-15 DIAGNOSIS — Z23 Encounter for immunization: Secondary | ICD-10-CM | POA: Diagnosis not present

## 2015-02-15 DIAGNOSIS — L259 Unspecified contact dermatitis, unspecified cause: Secondary | ICD-10-CM | POA: Diagnosis not present

## 2015-02-15 MED ORDER — TRIAMCINOLONE ACETONIDE 0.1 % EX CREA: 1.0000 "application " | TOPICAL_CREAM | Freq: Two times a day (BID) | CUTANEOUS | Status: AC

## 2015-02-15 MED ORDER — PREDNISONE 10 MG PO TABS: ORAL_TABLET | ORAL | Status: DC

## 2015-02-15 NOTE — Progress Notes (Signed)
Name: Jamie Fischer   MRN: [id number]    DOB: 04/28/1920   Date:02/15/2015       Progress Note  Subjective  Chief Complaint  Chief Complaint  Patient presents with  . Rash    itches and red- tried a "horse medicine" been awhile since used it- rash not getting better    Rash This is a chronic problem. The current episode started 1 to 4 weeks ago. The problem has been gradually worsening since onset. The affected locations include the left lower leg and right lowerleg. The rash is characterized by redness and itchiness (blaches with pressure). It is unknown if there was an exposure to a precipitant. Pertinent negatives include no anorexia, congestion, cough, diarrhea, eye pain, facial edema, fatigue, fever, joint pain, shortness of breath or sore throat.    No problem-specific assessment & plan notes found for this encounter.   Past Medical History  Diagnosis Date  . Thyroid disease   . COPD (chronic obstructive pulmonary disease)   . Depression   . Anxiety   . GERD (gastroesophageal reflux disease)   . BPH (benign prostatic hyperplasia)     Past Surgical History  Procedure Laterality Date  . Shoulder surgery Right     replacement  . Replacement total knee Right   . Cholecystectomy    . Colonoscopy  2016    bleeding ulcer    History reviewed. No pertinent family history.  Social History   Social History  . Marital Status: Married    Spouse Name: N/A  . Number of Children: N/A  . Years of Education: N/A   Occupational History  . Not on file.   Social History Main Topics  . Smoking status: Never Smoker   . Smokeless tobacco: Not on file  . Alcohol Use: No  . Drug Use: No  . Sexual Activity: No   Other Topics Concern  . Not on file   Social History Narrative  . No narrative on file    Allergies  Allergen Reactions  . Codeine Sulfate Other (See Comments)     Review of Systems  Constitutional: Negative for fever, chills, weight loss, malaise/fatigue  and fatigue.  HENT: Negative for congestion, ear discharge, ear pain and sore throat.   Eyes: Negative for blurred vision and pain.  Respiratory: Negative for cough, sputum production, shortness of breath and wheezing.   Cardiovascular: Negative for chest pain, palpitations and leg swelling.  Gastrointestinal: Negative for heartburn, nausea, abdominal pain, diarrhea, constipation, blood in stool, melena and anorexia.  Genitourinary: Negative for dysuria, urgency, frequency and hematuria.  Musculoskeletal: Negative for myalgias, back pain, joint pain and neck pain.  Skin: Positive for rash.  Neurological: Negative for dizziness, tingling, sensory change, focal weakness and headaches.  Endo/Heme/Allergies: Negative for environmental allergies and polydipsia. Does not bruise/bleed easily.  Psychiatric/Behavioral: Negative for depression and suicidal ideas. The patient is not nervous/anxious and does not have insomnia.      Objective  Filed Vitals:   02/15/15 1359  BP: 120/80  Pulse: 64  Height:  (1.651 m)  Weight: 175 lb (79.379 kg)    Physical Exam  Constitutional: He is oriented to person, place, and time and well-developed, well-nourished, and in no distress.  HENT:  Head: Normocephalic.  Right Ear: External ear normal.  Left Ear: External ear normal.  Nose: Nose normal.  Mouth/Throat: Oropharynx is clear and moist.  Eyes: Conjunctivae and EOM are normal. Pupils are equal, round, and reactive to light. Right eye  exhibits no discharge. Left eye exhibits no discharge. No scleral icterus.  Neck: Normal range of motion. Neck supple. No JVD present. No tracheal deviation present. No thyromegaly present.  Cardiovascular: Normal rate, regular rhythm, normal heart sounds and intact distal pulses.  Exam reveals no gallop and no friction rub.   No murmur heard. Pulmonary/Chest: Breath sounds normal. No respiratory distress. He has no wheezes. He has no rales.  Abdominal: Soft. Bowel  sounds are normal. He exhibits no mass. There is no hepatosplenomegaly. There is no tenderness. There is no rebound, no guarding and no CVA tenderness.  Musculoskeletal: Normal range of motion. He exhibits no edema or tenderness.  Lymphadenopathy:    He has no cervical adenopathy.  Neurological: He is alert and oriented to person, place, and time. He has normal sensation, normal strength, normal reflexes and intact cranial nerves. No cranial nerve deficit.  Skin: Skin is warm. Rash noted. No purpura noted. Rash is macular.  Psychiatric: Mood and affect normal.      Assessment & Plan  Problem List Items Addressed This Visit    None    Visit Diagnoses    suggested aveeno compresses    -  Primary    Relevant Medications    predniSONE (DELTASONE) 10 MG tablet    triamcinolone cream (KENALOG) 0.1 %         Dr. Hayden [facility]  02/15/2015

## 2015-02-15 NOTE — Addendum Note (Signed)
Addended by: Everitt Amber on: 02/15/2015 02:45 PM   Modules accepted: Orders

## 2015-03-04 ENCOUNTER — Ambulatory Visit: Payer: Medicare Other | Admitting: Family Medicine

## 2015-03-04 DIAGNOSIS — I252 Old myocardial infarction: Secondary | ICD-10-CM | POA: Diagnosis not present

## 2015-03-04 DIAGNOSIS — Z9981 Dependence on supplemental oxygen: Secondary | ICD-10-CM | POA: Diagnosis not present

## 2015-03-04 DIAGNOSIS — K5900 Constipation, unspecified: Secondary | ICD-10-CM | POA: Diagnosis not present

## 2015-03-04 DIAGNOSIS — K5660 Unspecified intestinal obstruction: Secondary | ICD-10-CM | POA: Diagnosis not present

## 2015-03-04 DIAGNOSIS — R531 Weakness: Secondary | ICD-10-CM | POA: Diagnosis not present

## 2015-03-04 DIAGNOSIS — R5383 Other fatigue: Secondary | ICD-10-CM | POA: Diagnosis not present

## 2015-03-04 DIAGNOSIS — Z87891 Personal history of nicotine dependence: Secondary | ICD-10-CM | POA: Diagnosis not present

## 2015-03-04 DIAGNOSIS — N401 Enlarged prostate with lower urinary tract symptoms: Secondary | ICD-10-CM | POA: Diagnosis not present

## 2015-03-04 DIAGNOSIS — R52 Pain, unspecified: Secondary | ICD-10-CM | POA: Diagnosis not present

## 2015-03-04 DIAGNOSIS — I447 Left bundle-branch block, unspecified: Secondary | ICD-10-CM | POA: Diagnosis not present

## 2015-03-04 DIAGNOSIS — J9811 Atelectasis: Secondary | ICD-10-CM | POA: Diagnosis not present

## 2015-03-04 DIAGNOSIS — R51 Headache: Secondary | ICD-10-CM | POA: Diagnosis not present

## 2015-03-04 DIAGNOSIS — F419 Anxiety disorder, unspecified: Secondary | ICD-10-CM | POA: Diagnosis not present

## 2015-03-04 DIAGNOSIS — Z66 Do not resuscitate: Secondary | ICD-10-CM | POA: Diagnosis not present

## 2015-03-04 DIAGNOSIS — N320 Bladder-neck obstruction: Secondary | ICD-10-CM | POA: Diagnosis not present

## 2015-03-04 DIAGNOSIS — R05 Cough: Secondary | ICD-10-CM | POA: Diagnosis not present

## 2015-03-04 DIAGNOSIS — R778 Other specified abnormalities of plasma proteins: Secondary | ICD-10-CM | POA: Diagnosis not present

## 2015-03-04 DIAGNOSIS — R000 Tachycardia, unspecified: Secondary | ICD-10-CM | POA: Diagnosis not present

## 2015-03-04 DIAGNOSIS — R0989 Other specified symptoms and signs involving the circulatory and respiratory systems: Secondary | ICD-10-CM | POA: Diagnosis not present

## 2015-03-04 DIAGNOSIS — K219 Gastro-esophageal reflux disease without esophagitis: Secondary | ICD-10-CM | POA: Diagnosis not present

## 2015-03-04 DIAGNOSIS — J449 Chronic obstructive pulmonary disease, unspecified: Secondary | ICD-10-CM | POA: Diagnosis not present

## 2015-03-04 DIAGNOSIS — E039 Hypothyroidism, unspecified: Secondary | ICD-10-CM | POA: Diagnosis not present

## 2015-03-04 DIAGNOSIS — I491 Atrial premature depolarization: Secondary | ICD-10-CM | POA: Diagnosis not present

## 2015-03-04 DIAGNOSIS — I4949 Other premature depolarization: Secondary | ICD-10-CM | POA: Diagnosis not present

## 2015-03-04 DIAGNOSIS — R7989 Other specified abnormal findings of blood chemistry: Secondary | ICD-10-CM | POA: Diagnosis not present

## 2015-03-04 DIAGNOSIS — E871 Hypo-osmolality and hyponatremia: Secondary | ICD-10-CM | POA: Diagnosis not present

## 2015-03-04 DIAGNOSIS — R338 Other retention of urine: Secondary | ICD-10-CM | POA: Diagnosis not present

## 2015-03-04 DIAGNOSIS — I248 Other forms of acute ischemic heart disease: Secondary | ICD-10-CM | POA: Diagnosis not present

## 2015-03-04 DIAGNOSIS — I498 Other specified cardiac arrhythmias: Secondary | ICD-10-CM | POA: Diagnosis not present

## 2015-03-04 DIAGNOSIS — K5909 Other constipation: Secondary | ICD-10-CM | POA: Diagnosis not present

## 2015-03-09 DIAGNOSIS — Z466 Encounter for fitting and adjustment of urinary device: Secondary | ICD-10-CM | POA: Diagnosis not present

## 2015-03-09 DIAGNOSIS — N401 Enlarged prostate with lower urinary tract symptoms: Secondary | ICD-10-CM | POA: Diagnosis not present

## 2015-03-09 DIAGNOSIS — F419 Anxiety disorder, unspecified: Secondary | ICD-10-CM | POA: Diagnosis not present

## 2015-03-09 DIAGNOSIS — J449 Chronic obstructive pulmonary disease, unspecified: Secondary | ICD-10-CM | POA: Diagnosis not present

## 2015-03-09 DIAGNOSIS — Z8711 Personal history of peptic ulcer disease: Secondary | ICD-10-CM | POA: Diagnosis not present

## 2015-03-09 DIAGNOSIS — E039 Hypothyroidism, unspecified: Secondary | ICD-10-CM | POA: Diagnosis not present

## 2015-03-09 DIAGNOSIS — E871 Hypo-osmolality and hyponatremia: Secondary | ICD-10-CM | POA: Diagnosis not present

## 2015-03-09 DIAGNOSIS — Z9981 Dependence on supplemental oxygen: Secondary | ICD-10-CM | POA: Diagnosis not present

## 2015-03-11 DIAGNOSIS — J449 Chronic obstructive pulmonary disease, unspecified: Secondary | ICD-10-CM | POA: Diagnosis not present

## 2015-03-11 DIAGNOSIS — N401 Enlarged prostate with lower urinary tract symptoms: Secondary | ICD-10-CM | POA: Diagnosis not present

## 2015-03-11 DIAGNOSIS — E871 Hypo-osmolality and hyponatremia: Secondary | ICD-10-CM | POA: Diagnosis not present

## 2015-03-11 DIAGNOSIS — Z466 Encounter for fitting and adjustment of urinary device: Secondary | ICD-10-CM | POA: Diagnosis not present

## 2015-03-11 DIAGNOSIS — Z9981 Dependence on supplemental oxygen: Secondary | ICD-10-CM | POA: Diagnosis not present

## 2015-03-11 DIAGNOSIS — E039 Hypothyroidism, unspecified: Secondary | ICD-10-CM | POA: Diagnosis not present

## 2015-03-11 DIAGNOSIS — Z8711 Personal history of peptic ulcer disease: Secondary | ICD-10-CM | POA: Diagnosis not present

## 2015-03-11 DIAGNOSIS — F419 Anxiety disorder, unspecified: Secondary | ICD-10-CM | POA: Diagnosis not present

## 2015-03-13 DIAGNOSIS — I2510 Atherosclerotic heart disease of native coronary artery without angina pectoris: Secondary | ICD-10-CM | POA: Diagnosis not present

## 2015-03-13 DIAGNOSIS — R52 Pain, unspecified: Secondary | ICD-10-CM | POA: Diagnosis not present

## 2015-03-13 DIAGNOSIS — N390 Urinary tract infection, site not specified: Secondary | ICD-10-CM | POA: Diagnosis not present

## 2015-03-13 DIAGNOSIS — E871 Hypo-osmolality and hyponatremia: Secondary | ICD-10-CM | POA: Diagnosis not present

## 2015-03-13 DIAGNOSIS — R319 Hematuria, unspecified: Secondary | ICD-10-CM | POA: Diagnosis not present

## 2015-03-13 DIAGNOSIS — Z885 Allergy status to narcotic agent status: Secondary | ICD-10-CM | POA: Diagnosis not present

## 2015-03-13 DIAGNOSIS — R338 Other retention of urine: Secondary | ICD-10-CM | POA: Diagnosis not present

## 2015-03-13 DIAGNOSIS — K5900 Constipation, unspecified: Secondary | ICD-10-CM | POA: Diagnosis not present

## 2015-03-13 DIAGNOSIS — J449 Chronic obstructive pulmonary disease, unspecified: Secondary | ICD-10-CM | POA: Diagnosis not present

## 2015-03-13 DIAGNOSIS — Z79899 Other long term (current) drug therapy: Secondary | ICD-10-CM | POA: Diagnosis not present

## 2015-03-13 DIAGNOSIS — I252 Old myocardial infarction: Secondary | ICD-10-CM | POA: Diagnosis not present

## 2015-03-16 DIAGNOSIS — Z9981 Dependence on supplemental oxygen: Secondary | ICD-10-CM | POA: Diagnosis not present

## 2015-03-16 DIAGNOSIS — J449 Chronic obstructive pulmonary disease, unspecified: Secondary | ICD-10-CM | POA: Diagnosis not present

## 2015-03-16 DIAGNOSIS — Z466 Encounter for fitting and adjustment of urinary device: Secondary | ICD-10-CM | POA: Diagnosis not present

## 2015-03-16 DIAGNOSIS — N401 Enlarged prostate with lower urinary tract symptoms: Secondary | ICD-10-CM | POA: Diagnosis not present

## 2015-03-16 DIAGNOSIS — E039 Hypothyroidism, unspecified: Secondary | ICD-10-CM | POA: Diagnosis not present

## 2015-03-16 DIAGNOSIS — Z8711 Personal history of peptic ulcer disease: Secondary | ICD-10-CM | POA: Diagnosis not present

## 2015-03-16 DIAGNOSIS — E871 Hypo-osmolality and hyponatremia: Secondary | ICD-10-CM | POA: Diagnosis not present

## 2015-03-16 DIAGNOSIS — J9601 Acute respiratory failure with hypoxia: Secondary | ICD-10-CM | POA: Diagnosis not present

## 2015-03-16 DIAGNOSIS — F419 Anxiety disorder, unspecified: Secondary | ICD-10-CM | POA: Diagnosis not present

## 2015-03-17 DIAGNOSIS — Z9981 Dependence on supplemental oxygen: Secondary | ICD-10-CM | POA: Diagnosis not present

## 2015-03-17 DIAGNOSIS — Z466 Encounter for fitting and adjustment of urinary device: Secondary | ICD-10-CM | POA: Diagnosis not present

## 2015-03-17 DIAGNOSIS — E039 Hypothyroidism, unspecified: Secondary | ICD-10-CM | POA: Diagnosis not present

## 2015-03-17 DIAGNOSIS — J449 Chronic obstructive pulmonary disease, unspecified: Secondary | ICD-10-CM | POA: Diagnosis not present

## 2015-03-17 DIAGNOSIS — Z8711 Personal history of peptic ulcer disease: Secondary | ICD-10-CM | POA: Diagnosis not present

## 2015-03-17 DIAGNOSIS — N401 Enlarged prostate with lower urinary tract symptoms: Secondary | ICD-10-CM | POA: Diagnosis not present

## 2015-03-17 DIAGNOSIS — E871 Hypo-osmolality and hyponatremia: Secondary | ICD-10-CM | POA: Diagnosis not present

## 2015-03-17 DIAGNOSIS — F419 Anxiety disorder, unspecified: Secondary | ICD-10-CM | POA: Diagnosis not present

## 2015-03-18 DIAGNOSIS — R338 Other retention of urine: Secondary | ICD-10-CM | POA: Diagnosis not present

## 2015-03-18 DIAGNOSIS — Z466 Encounter for fitting and adjustment of urinary device: Secondary | ICD-10-CM | POA: Diagnosis not present

## 2015-03-18 DIAGNOSIS — F419 Anxiety disorder, unspecified: Secondary | ICD-10-CM | POA: Diagnosis not present

## 2015-03-18 DIAGNOSIS — N401 Enlarged prostate with lower urinary tract symptoms: Secondary | ICD-10-CM | POA: Diagnosis not present

## 2015-03-18 DIAGNOSIS — E039 Hypothyroidism, unspecified: Secondary | ICD-10-CM | POA: Diagnosis not present

## 2015-03-18 DIAGNOSIS — Z9981 Dependence on supplemental oxygen: Secondary | ICD-10-CM | POA: Diagnosis not present

## 2015-03-18 DIAGNOSIS — E871 Hypo-osmolality and hyponatremia: Secondary | ICD-10-CM | POA: Diagnosis not present

## 2015-03-18 DIAGNOSIS — J449 Chronic obstructive pulmonary disease, unspecified: Secondary | ICD-10-CM | POA: Diagnosis not present

## 2015-03-18 DIAGNOSIS — Z8711 Personal history of peptic ulcer disease: Secondary | ICD-10-CM | POA: Diagnosis not present

## 2015-03-19 DIAGNOSIS — Z8711 Personal history of peptic ulcer disease: Secondary | ICD-10-CM | POA: Diagnosis not present

## 2015-03-19 DIAGNOSIS — E871 Hypo-osmolality and hyponatremia: Secondary | ICD-10-CM | POA: Diagnosis not present

## 2015-03-19 DIAGNOSIS — F419 Anxiety disorder, unspecified: Secondary | ICD-10-CM | POA: Diagnosis not present

## 2015-03-19 DIAGNOSIS — R531 Weakness: Secondary | ICD-10-CM | POA: Diagnosis not present

## 2015-03-19 DIAGNOSIS — N401 Enlarged prostate with lower urinary tract symptoms: Secondary | ICD-10-CM | POA: Diagnosis not present

## 2015-03-19 DIAGNOSIS — Z466 Encounter for fitting and adjustment of urinary device: Secondary | ICD-10-CM | POA: Diagnosis not present

## 2015-03-19 DIAGNOSIS — J449 Chronic obstructive pulmonary disease, unspecified: Secondary | ICD-10-CM | POA: Diagnosis not present

## 2015-03-19 DIAGNOSIS — Z9981 Dependence on supplemental oxygen: Secondary | ICD-10-CM | POA: Diagnosis not present

## 2015-03-19 DIAGNOSIS — E039 Hypothyroidism, unspecified: Secondary | ICD-10-CM | POA: Diagnosis not present

## 2015-03-22 DIAGNOSIS — F419 Anxiety disorder, unspecified: Secondary | ICD-10-CM | POA: Diagnosis not present

## 2015-03-22 DIAGNOSIS — E871 Hypo-osmolality and hyponatremia: Secondary | ICD-10-CM | POA: Diagnosis not present

## 2015-03-22 DIAGNOSIS — Z8711 Personal history of peptic ulcer disease: Secondary | ICD-10-CM | POA: Diagnosis not present

## 2015-03-22 DIAGNOSIS — Z9981 Dependence on supplemental oxygen: Secondary | ICD-10-CM | POA: Diagnosis not present

## 2015-03-22 DIAGNOSIS — N401 Enlarged prostate with lower urinary tract symptoms: Secondary | ICD-10-CM | POA: Diagnosis not present

## 2015-03-22 DIAGNOSIS — E039 Hypothyroidism, unspecified: Secondary | ICD-10-CM | POA: Diagnosis not present

## 2015-03-22 DIAGNOSIS — J449 Chronic obstructive pulmonary disease, unspecified: Secondary | ICD-10-CM | POA: Diagnosis not present

## 2015-03-22 DIAGNOSIS — Z466 Encounter for fitting and adjustment of urinary device: Secondary | ICD-10-CM | POA: Diagnosis not present

## 2015-03-24 DIAGNOSIS — E871 Hypo-osmolality and hyponatremia: Secondary | ICD-10-CM | POA: Diagnosis not present

## 2015-03-24 DIAGNOSIS — F419 Anxiety disorder, unspecified: Secondary | ICD-10-CM | POA: Diagnosis not present

## 2015-03-24 DIAGNOSIS — Z9981 Dependence on supplemental oxygen: Secondary | ICD-10-CM | POA: Diagnosis not present

## 2015-03-24 DIAGNOSIS — J449 Chronic obstructive pulmonary disease, unspecified: Secondary | ICD-10-CM | POA: Diagnosis not present

## 2015-03-24 DIAGNOSIS — Z8711 Personal history of peptic ulcer disease: Secondary | ICD-10-CM | POA: Diagnosis not present

## 2015-03-24 DIAGNOSIS — E039 Hypothyroidism, unspecified: Secondary | ICD-10-CM | POA: Diagnosis not present

## 2015-03-24 DIAGNOSIS — N401 Enlarged prostate with lower urinary tract symptoms: Secondary | ICD-10-CM | POA: Diagnosis not present

## 2015-03-24 DIAGNOSIS — Z466 Encounter for fitting and adjustment of urinary device: Secondary | ICD-10-CM | POA: Diagnosis not present

## 2015-03-25 DIAGNOSIS — E039 Hypothyroidism, unspecified: Secondary | ICD-10-CM | POA: Diagnosis not present

## 2015-03-25 DIAGNOSIS — Z466 Encounter for fitting and adjustment of urinary device: Secondary | ICD-10-CM | POA: Diagnosis not present

## 2015-03-25 DIAGNOSIS — J449 Chronic obstructive pulmonary disease, unspecified: Secondary | ICD-10-CM | POA: Diagnosis not present

## 2015-03-25 DIAGNOSIS — E871 Hypo-osmolality and hyponatremia: Secondary | ICD-10-CM | POA: Diagnosis not present

## 2015-03-25 DIAGNOSIS — Z9981 Dependence on supplemental oxygen: Secondary | ICD-10-CM | POA: Diagnosis not present

## 2015-03-25 DIAGNOSIS — N401 Enlarged prostate with lower urinary tract symptoms: Secondary | ICD-10-CM | POA: Diagnosis not present

## 2015-03-25 DIAGNOSIS — F419 Anxiety disorder, unspecified: Secondary | ICD-10-CM | POA: Diagnosis not present

## 2015-03-25 DIAGNOSIS — Z8711 Personal history of peptic ulcer disease: Secondary | ICD-10-CM | POA: Diagnosis not present

## 2015-03-26 DIAGNOSIS — J449 Chronic obstructive pulmonary disease, unspecified: Secondary | ICD-10-CM | POA: Diagnosis not present

## 2015-03-26 DIAGNOSIS — Z9981 Dependence on supplemental oxygen: Secondary | ICD-10-CM | POA: Diagnosis not present

## 2015-03-26 DIAGNOSIS — N401 Enlarged prostate with lower urinary tract symptoms: Secondary | ICD-10-CM | POA: Diagnosis not present

## 2015-03-26 DIAGNOSIS — E039 Hypothyroidism, unspecified: Secondary | ICD-10-CM | POA: Diagnosis not present

## 2015-03-26 DIAGNOSIS — E871 Hypo-osmolality and hyponatremia: Secondary | ICD-10-CM | POA: Diagnosis not present

## 2015-03-26 DIAGNOSIS — Z8711 Personal history of peptic ulcer disease: Secondary | ICD-10-CM | POA: Diagnosis not present

## 2015-03-26 DIAGNOSIS — Z466 Encounter for fitting and adjustment of urinary device: Secondary | ICD-10-CM | POA: Diagnosis not present

## 2015-03-26 DIAGNOSIS — F419 Anxiety disorder, unspecified: Secondary | ICD-10-CM | POA: Diagnosis not present

## 2015-03-29 DIAGNOSIS — E871 Hypo-osmolality and hyponatremia: Secondary | ICD-10-CM | POA: Diagnosis not present

## 2015-03-29 DIAGNOSIS — Z466 Encounter for fitting and adjustment of urinary device: Secondary | ICD-10-CM | POA: Diagnosis not present

## 2015-03-29 DIAGNOSIS — F419 Anxiety disorder, unspecified: Secondary | ICD-10-CM | POA: Diagnosis not present

## 2015-03-29 DIAGNOSIS — Z9981 Dependence on supplemental oxygen: Secondary | ICD-10-CM | POA: Diagnosis not present

## 2015-03-29 DIAGNOSIS — N401 Enlarged prostate with lower urinary tract symptoms: Secondary | ICD-10-CM | POA: Diagnosis not present

## 2015-03-29 DIAGNOSIS — E039 Hypothyroidism, unspecified: Secondary | ICD-10-CM | POA: Diagnosis not present

## 2015-03-29 DIAGNOSIS — J449 Chronic obstructive pulmonary disease, unspecified: Secondary | ICD-10-CM | POA: Diagnosis not present

## 2015-03-29 DIAGNOSIS — Z8711 Personal history of peptic ulcer disease: Secondary | ICD-10-CM | POA: Diagnosis not present

## 2015-03-31 DIAGNOSIS — E039 Hypothyroidism, unspecified: Secondary | ICD-10-CM | POA: Diagnosis not present

## 2015-03-31 DIAGNOSIS — N401 Enlarged prostate with lower urinary tract symptoms: Secondary | ICD-10-CM | POA: Diagnosis not present

## 2015-03-31 DIAGNOSIS — F419 Anxiety disorder, unspecified: Secondary | ICD-10-CM | POA: Diagnosis not present

## 2015-03-31 DIAGNOSIS — Z8711 Personal history of peptic ulcer disease: Secondary | ICD-10-CM | POA: Diagnosis not present

## 2015-03-31 DIAGNOSIS — E871 Hypo-osmolality and hyponatremia: Secondary | ICD-10-CM | POA: Diagnosis not present

## 2015-03-31 DIAGNOSIS — Z466 Encounter for fitting and adjustment of urinary device: Secondary | ICD-10-CM | POA: Diagnosis not present

## 2015-03-31 DIAGNOSIS — Z9981 Dependence on supplemental oxygen: Secondary | ICD-10-CM | POA: Diagnosis not present

## 2015-03-31 DIAGNOSIS — J449 Chronic obstructive pulmonary disease, unspecified: Secondary | ICD-10-CM | POA: Diagnosis not present

## 2015-04-01 ENCOUNTER — Other Ambulatory Visit: Payer: Self-pay | Admitting: Family Medicine

## 2015-04-05 DIAGNOSIS — E039 Hypothyroidism, unspecified: Secondary | ICD-10-CM | POA: Diagnosis not present

## 2015-04-05 DIAGNOSIS — Z466 Encounter for fitting and adjustment of urinary device: Secondary | ICD-10-CM | POA: Diagnosis not present

## 2015-04-05 DIAGNOSIS — F419 Anxiety disorder, unspecified: Secondary | ICD-10-CM | POA: Diagnosis not present

## 2015-04-05 DIAGNOSIS — N401 Enlarged prostate with lower urinary tract symptoms: Secondary | ICD-10-CM | POA: Diagnosis not present

## 2015-04-05 DIAGNOSIS — Z8711 Personal history of peptic ulcer disease: Secondary | ICD-10-CM | POA: Diagnosis not present

## 2015-04-05 DIAGNOSIS — E871 Hypo-osmolality and hyponatremia: Secondary | ICD-10-CM | POA: Diagnosis not present

## 2015-04-05 DIAGNOSIS — Z9981 Dependence on supplemental oxygen: Secondary | ICD-10-CM | POA: Diagnosis not present

## 2015-04-05 DIAGNOSIS — J449 Chronic obstructive pulmonary disease, unspecified: Secondary | ICD-10-CM | POA: Diagnosis not present

## 2015-04-12 ENCOUNTER — Other Ambulatory Visit: Payer: Self-pay

## 2015-04-12 ENCOUNTER — Other Ambulatory Visit: Payer: Self-pay | Admitting: Family Medicine

## 2015-04-16 DIAGNOSIS — J9601 Acute respiratory failure with hypoxia: Secondary | ICD-10-CM | POA: Diagnosis not present

## 2015-04-19 DIAGNOSIS — N401 Enlarged prostate with lower urinary tract symptoms: Secondary | ICD-10-CM | POA: Diagnosis not present

## 2015-04-19 DIAGNOSIS — R531 Weakness: Secondary | ICD-10-CM | POA: Diagnosis not present

## 2015-04-19 DIAGNOSIS — J449 Chronic obstructive pulmonary disease, unspecified: Secondary | ICD-10-CM | POA: Diagnosis not present

## 2015-04-20 DIAGNOSIS — N401 Enlarged prostate with lower urinary tract symptoms: Secondary | ICD-10-CM | POA: Diagnosis not present

## 2015-04-20 DIAGNOSIS — R3914 Feeling of incomplete bladder emptying: Secondary | ICD-10-CM | POA: Diagnosis not present

## 2015-04-21 DIAGNOSIS — R300 Dysuria: Secondary | ICD-10-CM | POA: Diagnosis not present

## 2015-05-16 DIAGNOSIS — J9601 Acute respiratory failure with hypoxia: Secondary | ICD-10-CM | POA: Diagnosis not present

## 2015-05-19 DIAGNOSIS — N401 Enlarged prostate with lower urinary tract symptoms: Secondary | ICD-10-CM | POA: Diagnosis not present

## 2015-05-19 DIAGNOSIS — R531 Weakness: Secondary | ICD-10-CM | POA: Diagnosis not present

## 2015-05-19 DIAGNOSIS — J449 Chronic obstructive pulmonary disease, unspecified: Secondary | ICD-10-CM | POA: Diagnosis not present

## 2015-06-04 ENCOUNTER — Encounter: Payer: Self-pay | Admitting: Family Medicine

## 2015-06-04 ENCOUNTER — Ambulatory Visit (INDEPENDENT_AMBULATORY_CARE_PROVIDER_SITE_OTHER): Payer: Medicare Other | Admitting: Family Medicine

## 2015-06-04 VITALS — BP 124/74 | HR 62 | Ht 65.0 in | Wt 175.0 lb

## 2015-06-04 DIAGNOSIS — N390 Urinary tract infection, site not specified: Secondary | ICD-10-CM | POA: Diagnosis not present

## 2015-06-04 DIAGNOSIS — J449 Chronic obstructive pulmonary disease, unspecified: Secondary | ICD-10-CM | POA: Diagnosis not present

## 2015-06-04 DIAGNOSIS — T83511D Infection and inflammatory reaction due to indwelling urethral catheter, subsequent encounter: Principal | ICD-10-CM

## 2015-06-04 LAB — POCT URINALYSIS DIPSTICK
Bilirubin, UA: NEGATIVE
GLUCOSE UA: NEGATIVE
Ketones, UA: NEGATIVE
LEUKOCYTES UA: NEGATIVE
NITRITE UA: NEGATIVE
Protein, UA: NEGATIVE
RBC UA: NEGATIVE
Spec Grav, UA: 1.01
UROBILINOGEN UA: 0.2
pH, UA: 5

## 2015-06-04 MED ORDER — AMOXICILLIN-POT CLAVULANATE 875-125 MG PO TABS: 1.0000 | ORAL_TABLET | Freq: Two times a day (BID) | ORAL | Status: DC

## 2015-06-04 NOTE — Progress Notes (Signed)
Name: Jamie Fischer   MRN: [id number]    DOB: 06/11/1919   Date:06/04/2015       Progress Note  Subjective  Chief Complaint  Chief Complaint  Patient presents with  . Follow-up    needs referral to pulmonology- needs 24 hr O2    Urinary Tract Infection  This is a new problem. The current episode started in the past 7 days. The problem occurs intermittently. The problem has been waxing and waning. The quality of the pain is described as burning. The pain is mild. Associated symptoms include frequency. Pertinent negatives include no chills, discharge, flank pain, hematuria, hesitancy, nausea, possible pregnancy, sweats, urgency or vomiting. He has tried nothing for the symptoms. The treatment provided mild relief.  Shortness of Breath This is a chronic problem. The current episode started 1 to 4 weeks ago. The problem occurs daily. The problem has been waxing and waning. Pertinent negatives include no abdominal pain, chest pain, claudication, ear pain, fever, headaches, leg swelling, neck pain, rash, sore throat, sputum production, vomiting or wheezing. The symptoms are aggravated by any activity. Treatments tried: oxygen supplementation.    No problem-specific assessment & plan notes found for this encounter.   Past Medical History  Diagnosis Date  . Thyroid disease   . COPD (chronic obstructive pulmonary disease) (HCC)   . Depression   . Anxiety   . GERD (gastroesophageal reflux disease)   . BPH (benign prostatic hyperplasia)     Past Surgical History  Procedure Laterality Date  . Shoulder surgery Right     replacement  . Replacement total knee Right   . Cholecystectomy    . Colonoscopy  2016    bleeding ulcer    History reviewed. No pertinent family history.  Social History   Social History  . Marital Status: Married    Spouse Name: N/A  . Number of Children: N/A  . Years of Education: N/A   Occupational History  . Not on file.   Social History Main Topics  .  Smoking status: Never Smoker   . Smokeless tobacco: Not on file  . Alcohol Use: No  . Drug Use: No  . Sexual Activity: No   Other Topics Concern  . Not on file   Social History Narrative    Allergies  Allergen Reactions  . Codeine Sulfate Other (See Comments)     Review of Systems  Constitutional: Negative for fever, chills, weight loss and malaise/fatigue.  HENT: Negative for ear discharge, ear pain and sore throat.   Eyes: Negative for blurred vision.  Respiratory: Positive for shortness of breath. Negative for cough, sputum production and wheezing.   Cardiovascular: Negative for chest pain, palpitations, claudication and leg swelling.  Gastrointestinal: Negative for heartburn, nausea, vomiting, abdominal pain, diarrhea, constipation, blood in stool and melena.  Genitourinary: Positive for frequency. Negative for dysuria, hesitancy, urgency, hematuria and flank pain.  Musculoskeletal: Negative for myalgias, back pain, joint pain and neck pain.  Skin: Negative for rash.  Neurological: Negative for dizziness, tingling, sensory change, focal weakness and headaches.  Endo/Heme/Allergies: Negative for environmental allergies and polydipsia. Does not bruise/bleed easily.  Psychiatric/Behavioral: Negative for depression and suicidal ideas. The patient is not nervous/anxious and does not have insomnia.      Objective  Filed Vitals:   06/04/15 1400  BP: 124/74  Pulse: 62  Height: 5\' 5"  (1.651 m)  Weight: 175 lb (79.379 kg)    Physical Exam  Constitutional: He is oriented to person, place, and  time and well-developed, well-nourished, and in no distress.  HENT:  Head: Normocephalic.  Right Ear: External ear normal.  Left Ear: External ear normal.  Nose: Nose normal.  Mouth/Throat: Oropharynx is clear and moist.  Eyes: Conjunctivae and EOM are normal. Pupils are equal, round, and reactive to light. Right eye exhibits no discharge. Left eye exhibits no discharge. No scleral  icterus.  Neck: Normal range of motion. Neck supple. No JVD present. No tracheal deviation present. No thyromegaly present.  Cardiovascular: Normal rate, regular rhythm, normal heart sounds and intact distal pulses.  Exam reveals no gallop and no friction rub.   No murmur heard. Pulmonary/Chest: Breath sounds normal. No respiratory distress. He has no wheezes. He has no rales.  Abdominal: Soft. Bowel sounds are normal. He exhibits no mass. There is no hepatosplenomegaly. There is no tenderness. There is no rebound, no guarding and no CVA tenderness.  Musculoskeletal: Normal range of motion. He exhibits no edema or tenderness.  Lymphadenopathy:    He has no cervical adenopathy.  Neurological: He is alert and oriented to person, place, and time. He has normal sensation, normal strength and intact cranial nerves. No cranial nerve deficit.  Skin: Skin is warm. No rash noted.  Psychiatric: Mood and affect normal.      Assessment & Plan  Problem List Items Addressed This Visit    None    Visit Diagnoses    Urinary tract infection associated with catheterization of urinary tract, subsequent encounter    -  Primary    Relevant Medications    amoxicillin-clavulanate (AUGMENTIN) 875-125 MG tablet    Other Relevant Orders    POCT Urinalysis Dipstick (Completed)    Chronic obstructive pulmonary disease, unspecified COPD type (HCC)        with low lung volumes/?fibrosis/ O2 per nasal canula    Relevant Orders    Ambulatory referral to Pulmonology         Dr. Elizabeth Sauereanna Megon [facility]  06/04/2015

## 2015-06-09 IMAGING — CR DG CHEST 2V
2 series · 2 of 2 positions shown · non-contrast
Comparison: 08/08/2014, 05/12/2013

CLINICAL DATA: Shortness of breath, fatigue for 4 weeks, uses
oxygen at night

EXAM:
CHEST  2 VIEW

[chest lat]
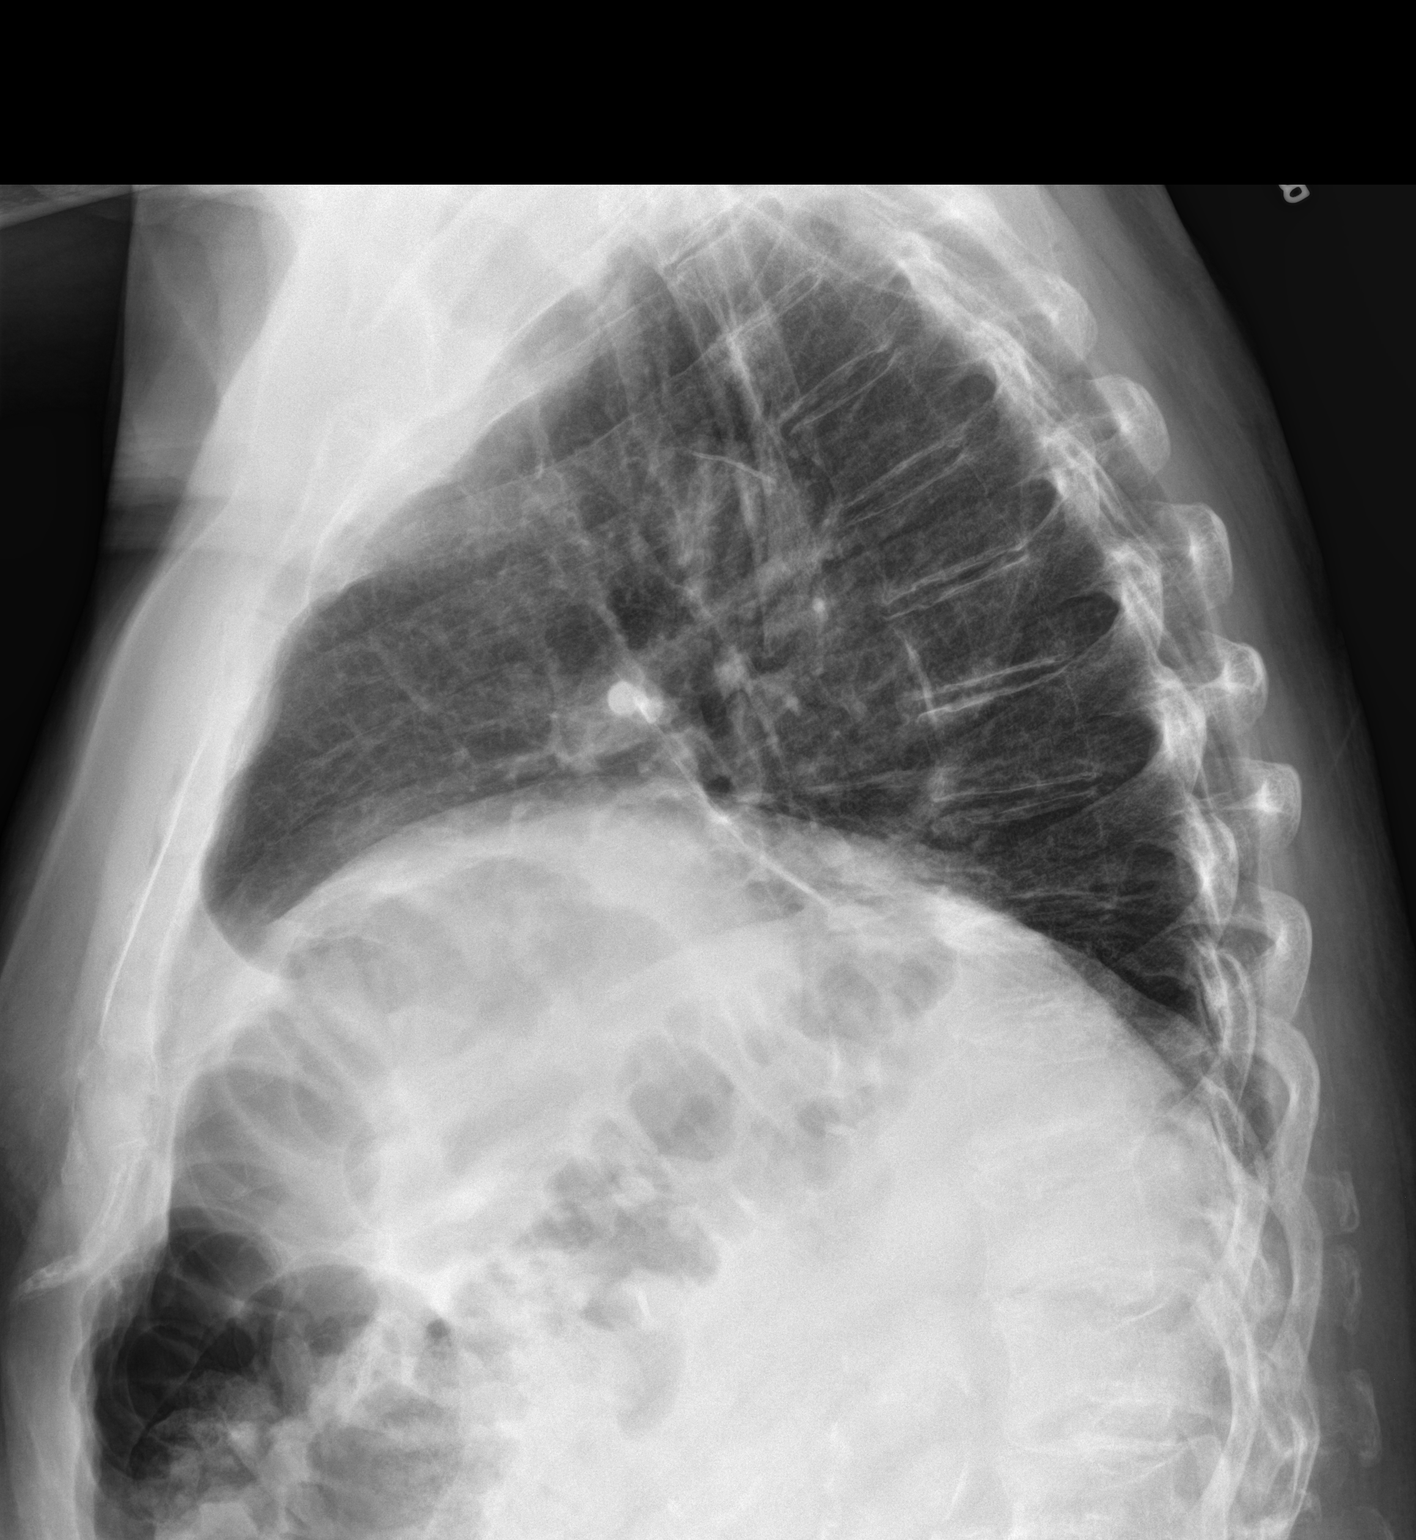

[chest ap]
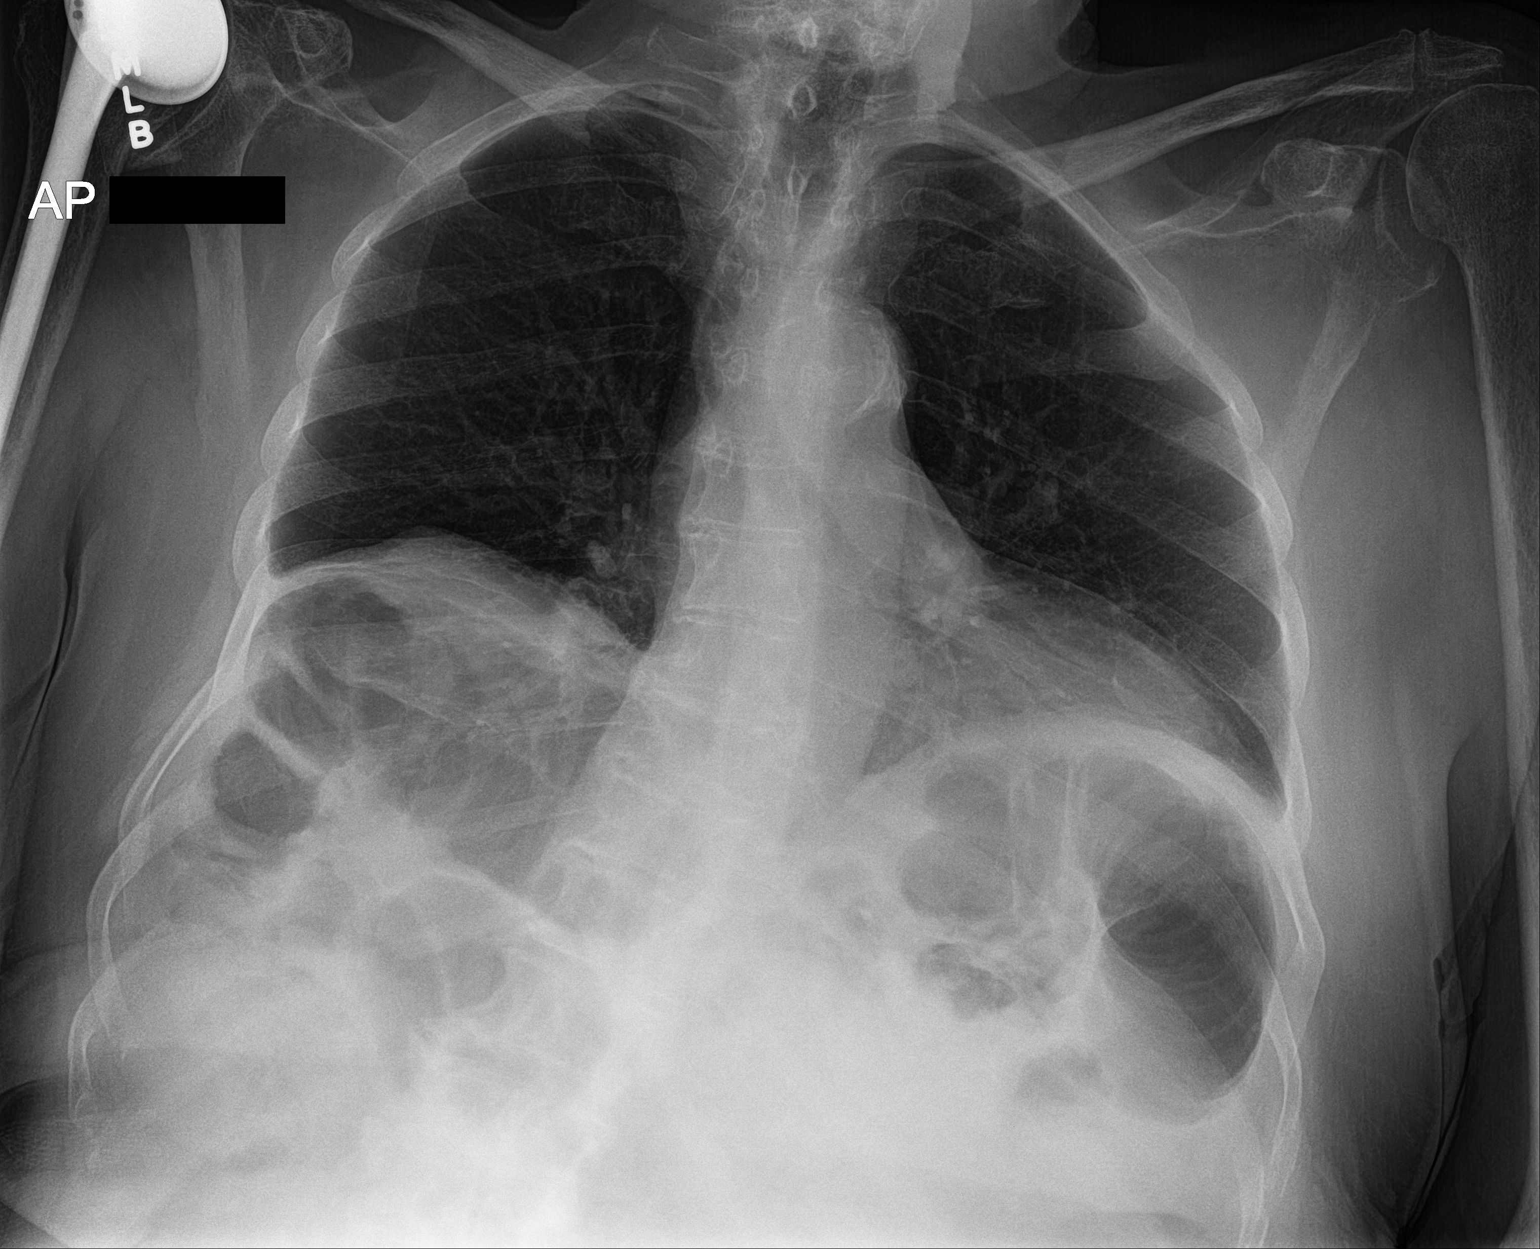

[2 of 2 positions shown; findings below may reference images not displayed]

FINDINGS: Limited inspiratory effect. Heart size normal. Vascular pattern
normal. Mild scarring or atelectasis at the lung bases, similar to
prior study.
IMPRESSION: No acute findings.  Stable bibasilar opacities.

## 2015-06-15 DIAGNOSIS — R0602 Shortness of breath: Secondary | ICD-10-CM | POA: Diagnosis not present

## 2015-06-15 DIAGNOSIS — Z885 Allergy status to narcotic agent status: Secondary | ICD-10-CM | POA: Diagnosis not present

## 2015-06-15 DIAGNOSIS — R918 Other nonspecific abnormal finding of lung field: Secondary | ICD-10-CM | POA: Diagnosis not present

## 2015-06-15 DIAGNOSIS — R531 Weakness: Secondary | ICD-10-CM | POA: Diagnosis not present

## 2015-06-15 DIAGNOSIS — R911 Solitary pulmonary nodule: Secondary | ICD-10-CM | POA: Diagnosis not present

## 2015-06-15 DIAGNOSIS — Z9981 Dependence on supplemental oxygen: Secondary | ICD-10-CM | POA: Diagnosis not present

## 2015-06-15 DIAGNOSIS — M19012 Primary osteoarthritis, left shoulder: Secondary | ICD-10-CM | POA: Diagnosis not present

## 2015-06-15 DIAGNOSIS — J449 Chronic obstructive pulmonary disease, unspecified: Secondary | ICD-10-CM | POA: Diagnosis not present

## 2015-06-15 DIAGNOSIS — J9811 Atelectasis: Secondary | ICD-10-CM | POA: Diagnosis not present

## 2015-06-15 DIAGNOSIS — K5909 Other constipation: Secondary | ICD-10-CM | POA: Diagnosis not present

## 2015-06-15 DIAGNOSIS — N401 Enlarged prostate with lower urinary tract symptoms: Secondary | ICD-10-CM | POA: Diagnosis not present

## 2015-06-15 DIAGNOSIS — N390 Urinary tract infection, site not specified: Secondary | ICD-10-CM | POA: Diagnosis not present

## 2015-06-15 DIAGNOSIS — Z7409 Other reduced mobility: Secondary | ICD-10-CM | POA: Diagnosis not present

## 2015-06-15 DIAGNOSIS — M8580 Other specified disorders of bone density and structure, unspecified site: Secondary | ICD-10-CM | POA: Diagnosis not present

## 2015-06-15 DIAGNOSIS — G4730 Sleep apnea, unspecified: Secondary | ICD-10-CM | POA: Diagnosis not present

## 2015-06-15 DIAGNOSIS — R3914 Feeling of incomplete bladder emptying: Secondary | ICD-10-CM | POA: Diagnosis not present

## 2015-06-15 DIAGNOSIS — G4700 Insomnia, unspecified: Secondary | ICD-10-CM | POA: Diagnosis not present

## 2015-06-15 DIAGNOSIS — E871 Hypo-osmolality and hyponatremia: Secondary | ICD-10-CM | POA: Diagnosis not present

## 2015-06-15 DIAGNOSIS — Z79891 Long term (current) use of opiate analgesic: Secondary | ICD-10-CM | POA: Diagnosis not present

## 2015-06-15 DIAGNOSIS — E039 Hypothyroidism, unspecified: Secondary | ICD-10-CM | POA: Diagnosis not present

## 2015-06-15 DIAGNOSIS — Z96611 Presence of right artificial shoulder joint: Secondary | ICD-10-CM | POA: Diagnosis not present

## 2015-06-15 DIAGNOSIS — Z79899 Other long term (current) drug therapy: Secondary | ICD-10-CM | POA: Diagnosis not present

## 2015-06-15 DIAGNOSIS — N3000 Acute cystitis without hematuria: Secondary | ICD-10-CM | POA: Diagnosis not present

## 2015-06-15 DIAGNOSIS — I252 Old myocardial infarction: Secondary | ICD-10-CM | POA: Diagnosis not present

## 2015-06-15 DIAGNOSIS — I2510 Atherosclerotic heart disease of native coronary artery without angina pectoris: Secondary | ICD-10-CM | POA: Diagnosis not present

## 2015-06-16 DIAGNOSIS — K5900 Constipation, unspecified: Secondary | ICD-10-CM | POA: Diagnosis not present

## 2015-06-16 DIAGNOSIS — R0602 Shortness of breath: Secondary | ICD-10-CM | POA: Diagnosis not present

## 2015-06-16 DIAGNOSIS — J9601 Acute respiratory failure with hypoxia: Secondary | ICD-10-CM | POA: Diagnosis not present

## 2015-06-16 DIAGNOSIS — J9811 Atelectasis: Secondary | ICD-10-CM | POA: Diagnosis not present

## 2015-06-16 DIAGNOSIS — N401 Enlarged prostate with lower urinary tract symptoms: Secondary | ICD-10-CM | POA: Diagnosis not present

## 2015-06-16 DIAGNOSIS — R0989 Other specified symptoms and signs involving the circulatory and respiratory systems: Secondary | ICD-10-CM | POA: Diagnosis not present

## 2015-06-16 DIAGNOSIS — N390 Urinary tract infection, site not specified: Secondary | ICD-10-CM | POA: Diagnosis not present

## 2015-06-16 DIAGNOSIS — R911 Solitary pulmonary nodule: Secondary | ICD-10-CM | POA: Diagnosis not present

## 2015-06-17 DIAGNOSIS — N390 Urinary tract infection, site not specified: Secondary | ICD-10-CM | POA: Diagnosis not present

## 2015-06-17 DIAGNOSIS — R0602 Shortness of breath: Secondary | ICD-10-CM | POA: Diagnosis not present

## 2015-06-17 DIAGNOSIS — I517 Cardiomegaly: Secondary | ICD-10-CM | POA: Diagnosis not present

## 2015-06-17 DIAGNOSIS — G4700 Insomnia, unspecified: Secondary | ICD-10-CM | POA: Diagnosis not present

## 2015-06-17 DIAGNOSIS — I519 Heart disease, unspecified: Secondary | ICD-10-CM | POA: Diagnosis not present

## 2015-06-17 DIAGNOSIS — I700 Atherosclerosis of aorta: Secondary | ICD-10-CM | POA: Diagnosis not present

## 2015-06-17 DIAGNOSIS — N401 Enlarged prostate with lower urinary tract symptoms: Secondary | ICD-10-CM | POA: Diagnosis not present

## 2015-06-23 DIAGNOSIS — N3000 Acute cystitis without hematuria: Secondary | ICD-10-CM | POA: Diagnosis not present

## 2015-06-23 DIAGNOSIS — R3914 Feeling of incomplete bladder emptying: Secondary | ICD-10-CM | POA: Diagnosis not present

## 2015-06-23 DIAGNOSIS — R0609 Other forms of dyspnea: Secondary | ICD-10-CM | POA: Diagnosis not present

## 2015-06-23 DIAGNOSIS — J449 Chronic obstructive pulmonary disease, unspecified: Secondary | ICD-10-CM | POA: Diagnosis not present

## 2015-06-23 DIAGNOSIS — A498 Other bacterial infections of unspecified site: Secondary | ICD-10-CM | POA: Diagnosis not present

## 2015-06-23 DIAGNOSIS — N401 Enlarged prostate with lower urinary tract symptoms: Secondary | ICD-10-CM | POA: Diagnosis not present

## 2015-06-24 ENCOUNTER — Ambulatory Visit (INDEPENDENT_AMBULATORY_CARE_PROVIDER_SITE_OTHER): Payer: Medicare Other | Admitting: Family Medicine

## 2015-06-24 ENCOUNTER — Encounter: Payer: Self-pay | Admitting: Family Medicine

## 2015-06-24 VITALS — BP 120/70 | HR 60 | Ht 65.0 in | Wt 175.0 lb

## 2015-06-24 DIAGNOSIS — B356 Tinea cruris: Secondary | ICD-10-CM | POA: Diagnosis not present

## 2015-06-24 DIAGNOSIS — Z09 Encounter for follow-up examination after completed treatment for conditions other than malignant neoplasm: Secondary | ICD-10-CM | POA: Diagnosis not present

## 2015-06-24 MED ORDER — FLUCONAZOLE 150 MG PO TABS
150.0000 mg | ORAL_TABLET | Freq: Once | ORAL | Status: DC
Start: 2015-06-24 — End: 2016-02-04

## 2015-06-24 MED ORDER — NYSTATIN 100000 UNIT/GM EX CREA: 1.0000 "application " | TOPICAL_CREAM | Freq: Two times a day (BID) | CUTANEOUS | Status: DC

## 2015-06-24 NOTE — Progress Notes (Signed)
Name: Jamie Fischer   MRN: [id number]    DOB: [birth year]   Date:06/24/2015       Progress Note  Subjective  Chief Complaint  Chief Complaint  Patient presents with  . Follow-up    discharged on 06/17/2015 from hospital    HPI Comments: Patient presents for hospital discharge follow up.    Shortness of Breath This is a chronic problem. The current episode started more than 1 year ago. The problem occurs intermittently. The problem has been waxing and waning. Pertinent negatives include no abdominal pain, chest pain, claudication, coryza, ear pain, fever, headaches, hemoptysis, leg pain, leg swelling, neck pain, orthopnea, PND, rash, rhinorrhea, sore throat, sputum production, swollen glands, syncope, vomiting or wheezing. Nothing aggravates the symptoms. The patient has no known risk factors for DVT/PE. He has tried nothing (oxygen prn at night) for the symptoms. The treatment provided mild relief. His past medical history is significant for a heart failure. There is no history of allergies, aspirin allergies, asthma, bronchiolitis, CAD, chronic lung disease, COPD, DVT, PE, pneumonia or a recent surgery.    No problem-specific assessment & plan notes found for this encounter.   Past Medical History  Diagnosis Date  . Thyroid disease   . COPD (chronic obstructive pulmonary disease) (HCC)   . Depression   . Anxiety   . GERD (gastroesophageal reflux disease)   . BPH (benign prostatic hyperplasia)     Past Surgical History  Procedure Laterality Date  . Shoulder surgery Right     replacement  . Replacement total knee Right   . Cholecystectomy    . Colonoscopy  2016    bleeding ulcer    History reviewed. No pertinent family history.  Social History   Social History  . Marital Status: Married    Spouse Name: N/A  . Number of Children: N/A  . Years of Education: N/A   Occupational History  . Not on file.   Social History Main Topics  . Smoking status: Never Smoker   .  Smokeless tobacco: Not on file  . Alcohol Use: No  . Drug Use: No  . Sexual Activity: No   Other Topics Concern  . Not on file   Social History Narrative    Allergies  Allergen Reactions  . Codeine Sulfate Other (See Comments)     Review of Systems  Constitutional: Negative for fever, chills, weight loss and malaise/fatigue.  HENT: Negative for ear discharge, ear pain, rhinorrhea and sore throat.   Eyes: Negative for blurred vision.  Respiratory: Positive for shortness of breath. Negative for cough, hemoptysis, sputum production and wheezing.   Cardiovascular: Negative for chest pain, palpitations, orthopnea, claudication, leg swelling, syncope and PND.  Gastrointestinal: Negative for heartburn, nausea, vomiting, abdominal pain, diarrhea, constipation, blood in stool and melena.  Genitourinary: Negative for dysuria, urgency, frequency and hematuria.  Musculoskeletal: Negative for myalgias, back pain, joint pain and neck pain.  Skin: Negative for rash.  Neurological: Negative for dizziness, tingling, sensory change, focal weakness and headaches.  Endo/Heme/Allergies: Negative for environmental allergies and polydipsia. Does not bruise/bleed easily.  Psychiatric/Behavioral: Negative for depression and suicidal ideas. The patient is not nervous/anxious and does not have insomnia.      Objective  Filed Vitals:   06/24/15 1112  BP: 120/70  Pulse: 60  Height:  (1.651 m)  Weight: 175 lb (79.379 kg)    Physical Exam  Constitutional: He is oriented to person, place, and time and well-developed, well-nourished, and in no  distress.  HENT:  Head: Normocephalic.  Right Ear: External ear normal.  Left Ear: External ear normal.  Nose: Nose normal.  Mouth/Throat: Oropharynx is clear and moist.  Eyes: Conjunctivae and EOM are normal. Pupils are equal, round, and reactive to light. Right eye exhibits no discharge. Left eye exhibits no discharge. No scleral icterus.  Neck:  Normal range of motion. Neck supple. No JVD present. No tracheal deviation present. No thyromegaly present.  Cardiovascular: Normal rate, regular rhythm, normal heart sounds and intact distal pulses.  Exam reveals no gallop and no friction rub.   No murmur Fischer. Pulmonary/Chest: Breath sounds normal. No respiratory distress. He has no wheezes. He has no rales.  Abdominal: Soft. Bowel sounds are normal. He exhibits no mass. There is no hepatosplenomegaly. There is no tenderness. There is no rebound, no guarding and no CVA tenderness.  Musculoskeletal: Normal range of motion. He exhibits no edema or tenderness.  Lymphadenopathy:    He has no cervical adenopathy.  Neurological: He is alert and oriented to person, place, and time. He has normal sensation, normal strength and intact cranial nerves. No cranial nerve deficit.  Skin: Skin is warm. No rash noted.  By hx rash in inguinal area  Psychiatric: Mood and affect normal.  Nursing note and vitals reviewed.     Assessment & Plan  Problem List Items Addressed This Visit    None    Visit Diagnoses    Hospital discharge follow-up    -  Primary    Tinea inguinalis        Relevant Medications    fluconazole (DIFLUCAN) 150 MG tablet    hydrocortisone cream-nystatin cream-zinc oxide         Dr. [facility]  06/24/2015

## 2015-06-25 DIAGNOSIS — R3914 Feeling of incomplete bladder emptying: Secondary | ICD-10-CM | POA: Diagnosis not present

## 2015-06-25 DIAGNOSIS — J449 Chronic obstructive pulmonary disease, unspecified: Secondary | ICD-10-CM | POA: Diagnosis not present

## 2015-06-25 DIAGNOSIS — N3000 Acute cystitis without hematuria: Secondary | ICD-10-CM | POA: Diagnosis not present

## 2015-06-25 DIAGNOSIS — N401 Enlarged prostate with lower urinary tract symptoms: Secondary | ICD-10-CM | POA: Diagnosis not present

## 2015-06-25 DIAGNOSIS — R0609 Other forms of dyspnea: Secondary | ICD-10-CM | POA: Diagnosis not present

## 2015-06-25 DIAGNOSIS — A498 Other bacterial infections of unspecified site: Secondary | ICD-10-CM | POA: Diagnosis not present

## 2015-06-29 DIAGNOSIS — J449 Chronic obstructive pulmonary disease, unspecified: Secondary | ICD-10-CM | POA: Diagnosis not present

## 2015-06-29 DIAGNOSIS — A498 Other bacterial infections of unspecified site: Secondary | ICD-10-CM | POA: Diagnosis not present

## 2015-06-29 DIAGNOSIS — R0609 Other forms of dyspnea: Secondary | ICD-10-CM | POA: Diagnosis not present

## 2015-06-29 DIAGNOSIS — N401 Enlarged prostate with lower urinary tract symptoms: Secondary | ICD-10-CM | POA: Diagnosis not present

## 2015-06-29 DIAGNOSIS — R3914 Feeling of incomplete bladder emptying: Secondary | ICD-10-CM | POA: Diagnosis not present

## 2015-06-29 DIAGNOSIS — N3000 Acute cystitis without hematuria: Secondary | ICD-10-CM | POA: Diagnosis not present

## 2015-07-01 DIAGNOSIS — N401 Enlarged prostate with lower urinary tract symptoms: Secondary | ICD-10-CM | POA: Diagnosis not present

## 2015-07-01 DIAGNOSIS — R3914 Feeling of incomplete bladder emptying: Secondary | ICD-10-CM | POA: Diagnosis not present

## 2015-07-01 DIAGNOSIS — A498 Other bacterial infections of unspecified site: Secondary | ICD-10-CM | POA: Diagnosis not present

## 2015-07-01 DIAGNOSIS — R0609 Other forms of dyspnea: Secondary | ICD-10-CM | POA: Diagnosis not present

## 2015-07-01 DIAGNOSIS — J449 Chronic obstructive pulmonary disease, unspecified: Secondary | ICD-10-CM | POA: Diagnosis not present

## 2015-07-01 DIAGNOSIS — N3000 Acute cystitis without hematuria: Secondary | ICD-10-CM | POA: Diagnosis not present

## 2015-07-04 DIAGNOSIS — J9601 Acute respiratory failure with hypoxia: Secondary | ICD-10-CM | POA: Diagnosis not present

## 2015-07-05 ENCOUNTER — Other Ambulatory Visit: Payer: Self-pay

## 2015-07-05 DIAGNOSIS — N390 Urinary tract infection, site not specified: Secondary | ICD-10-CM

## 2015-07-05 DIAGNOSIS — T83511D Infection and inflammatory reaction due to indwelling urethral catheter, subsequent encounter: Secondary | ICD-10-CM

## 2015-07-05 DIAGNOSIS — E039 Hypothyroidism, unspecified: Secondary | ICD-10-CM

## 2015-07-05 MED ORDER — CIPROFLOXACIN HCL 500 MG PO TABS: 500.0000 mg | ORAL_TABLET | Freq: Two times a day (BID) | ORAL | Status: DC

## 2015-07-05 MED ORDER — LEVOTHYROXINE SODIUM 50 MCG PO TABS: 50.0000 ug | ORAL_TABLET | Freq: Every day | ORAL | Status: DC

## 2015-07-06 DIAGNOSIS — A498 Other bacterial infections of unspecified site: Secondary | ICD-10-CM | POA: Diagnosis not present

## 2015-07-06 DIAGNOSIS — R3914 Feeling of incomplete bladder emptying: Secondary | ICD-10-CM | POA: Diagnosis not present

## 2015-07-06 DIAGNOSIS — N3000 Acute cystitis without hematuria: Secondary | ICD-10-CM | POA: Diagnosis not present

## 2015-07-06 DIAGNOSIS — J449 Chronic obstructive pulmonary disease, unspecified: Secondary | ICD-10-CM | POA: Diagnosis not present

## 2015-07-06 DIAGNOSIS — N401 Enlarged prostate with lower urinary tract symptoms: Secondary | ICD-10-CM | POA: Diagnosis not present

## 2015-07-06 DIAGNOSIS — R0609 Other forms of dyspnea: Secondary | ICD-10-CM | POA: Diagnosis not present

## 2015-07-07 DIAGNOSIS — R0609 Other forms of dyspnea: Secondary | ICD-10-CM | POA: Diagnosis not present

## 2015-07-07 DIAGNOSIS — J449 Chronic obstructive pulmonary disease, unspecified: Secondary | ICD-10-CM | POA: Diagnosis not present

## 2015-07-07 DIAGNOSIS — R0602 Shortness of breath: Secondary | ICD-10-CM | POA: Diagnosis not present

## 2015-07-08 ENCOUNTER — Other Ambulatory Visit: Payer: Self-pay

## 2015-07-08 DIAGNOSIS — N401 Enlarged prostate with lower urinary tract symptoms: Secondary | ICD-10-CM | POA: Diagnosis not present

## 2015-07-08 DIAGNOSIS — R3914 Feeling of incomplete bladder emptying: Secondary | ICD-10-CM | POA: Diagnosis not present

## 2015-07-08 DIAGNOSIS — R0609 Other forms of dyspnea: Secondary | ICD-10-CM | POA: Diagnosis not present

## 2015-07-08 DIAGNOSIS — A498 Other bacterial infections of unspecified site: Secondary | ICD-10-CM | POA: Diagnosis not present

## 2015-07-08 DIAGNOSIS — J449 Chronic obstructive pulmonary disease, unspecified: Secondary | ICD-10-CM | POA: Diagnosis not present

## 2015-07-08 DIAGNOSIS — N3000 Acute cystitis without hematuria: Secondary | ICD-10-CM | POA: Diagnosis not present

## 2015-07-09 DIAGNOSIS — R339 Retention of urine, unspecified: Secondary | ICD-10-CM | POA: Diagnosis not present

## 2015-07-14 DIAGNOSIS — A498 Other bacterial infections of unspecified site: Secondary | ICD-10-CM | POA: Diagnosis not present

## 2015-07-14 DIAGNOSIS — N401 Enlarged prostate with lower urinary tract symptoms: Secondary | ICD-10-CM | POA: Diagnosis not present

## 2015-07-14 DIAGNOSIS — J449 Chronic obstructive pulmonary disease, unspecified: Secondary | ICD-10-CM | POA: Diagnosis not present

## 2015-07-14 DIAGNOSIS — R3914 Feeling of incomplete bladder emptying: Secondary | ICD-10-CM | POA: Diagnosis not present

## 2015-07-14 DIAGNOSIS — N3000 Acute cystitis without hematuria: Secondary | ICD-10-CM | POA: Diagnosis not present

## 2015-07-14 DIAGNOSIS — R0609 Other forms of dyspnea: Secondary | ICD-10-CM | POA: Diagnosis not present

## 2015-08-01 DIAGNOSIS — J9601 Acute respiratory failure with hypoxia: Secondary | ICD-10-CM | POA: Diagnosis not present

## 2015-09-14 DIAGNOSIS — J9601 Acute respiratory failure with hypoxia: Secondary | ICD-10-CM | POA: Diagnosis not present

## 2015-10-14 DIAGNOSIS — J9601 Acute respiratory failure with hypoxia: Secondary | ICD-10-CM | POA: Diagnosis not present

## 2015-11-14 DIAGNOSIS — J9601 Acute respiratory failure with hypoxia: Secondary | ICD-10-CM | POA: Diagnosis not present

## 2015-11-24 ENCOUNTER — Other Ambulatory Visit: Payer: Self-pay

## 2015-12-08 ENCOUNTER — Other Ambulatory Visit: Payer: Self-pay

## 2015-12-08 MED ORDER — TAMSULOSIN HCL 0.4 MG PO CAPS: 0.8000 mg | ORAL_CAPSULE | Freq: Every day | ORAL | Status: DC

## 2015-12-09 ENCOUNTER — Other Ambulatory Visit: Payer: Self-pay

## 2015-12-09 MED ORDER — FINASTERIDE 5 MG PO TABS: 5.0000 mg | ORAL_TABLET | Freq: Every day | ORAL | Status: DC

## 2015-12-09 NOTE — Telephone Encounter (Signed)
Patient needs a refill on Finasteride.

## 2015-12-14 DIAGNOSIS — J9601 Acute respiratory failure with hypoxia: Secondary | ICD-10-CM | POA: Diagnosis not present

## 2016-01-14 DIAGNOSIS — J9601 Acute respiratory failure with hypoxia: Secondary | ICD-10-CM | POA: Diagnosis not present

## 2016-01-19 ENCOUNTER — Other Ambulatory Visit: Payer: Self-pay

## 2016-01-31 ENCOUNTER — Other Ambulatory Visit: Payer: Self-pay

## 2016-02-04 ENCOUNTER — Ambulatory Visit (INDEPENDENT_AMBULATORY_CARE_PROVIDER_SITE_OTHER): Payer: Medicare Other | Admitting: Family Medicine

## 2016-02-04 ENCOUNTER — Encounter: Payer: Self-pay | Admitting: Family Medicine

## 2016-02-04 VITALS — BP 120/70 | HR 62 | Ht 65.0 in | Wt 175.0 lb

## 2016-02-04 DIAGNOSIS — N400 Enlarged prostate without lower urinary tract symptoms: Secondary | ICD-10-CM

## 2016-02-04 DIAGNOSIS — K219 Gastro-esophageal reflux disease without esophagitis: Secondary | ICD-10-CM | POA: Diagnosis not present

## 2016-02-04 DIAGNOSIS — B356 Tinea cruris: Secondary | ICD-10-CM

## 2016-02-04 DIAGNOSIS — E039 Hypothyroidism, unspecified: Secondary | ICD-10-CM | POA: Diagnosis not present

## 2016-02-04 MED ORDER — FLUCONAZOLE 150 MG PO TABS: 150.0000 mg | ORAL_TABLET | Freq: Once | ORAL | 1 refills | Status: AC

## 2016-02-04 MED ORDER — PANTOPRAZOLE SODIUM 40 MG PO TBEC: 40.0000 mg | DELAYED_RELEASE_TABLET | Freq: Every day | ORAL | 5 refills | Status: AC

## 2016-02-04 MED ORDER — TAMSULOSIN HCL 0.4 MG PO CAPS: 0.8000 mg | ORAL_CAPSULE | Freq: Every day | ORAL | 0 refills | Status: DC

## 2016-02-04 MED ORDER — CLOTRIMAZOLE-BETAMETHASONE 1-0.05 % EX CREA: 1.0000 "application " | TOPICAL_CREAM | Freq: Two times a day (BID) | CUTANEOUS | 2 refills | Status: AC

## 2016-02-04 MED ORDER — FINASTERIDE 5 MG PO TABS: 5.0000 mg | ORAL_TABLET | Freq: Every day | ORAL | 6 refills | Status: DC

## 2016-02-04 MED ORDER — LEVOTHYROXINE SODIUM 50 MCG PO TABS: 50.0000 ug | ORAL_TABLET | Freq: Every day | ORAL | 6 refills | Status: DC

## 2016-02-04 NOTE — Progress Notes (Signed)
Name: Jamie Fischer   MRN: [id number]    DOB: [birth year]   Date:02/04/2016       Progress Note  Subjective  Chief Complaint  Chief Complaint  Patient presents with  . Benign Prostatic Hypertrophy  . Rash  . Hypothyroidism  . Insomnia    Benign Prostatic Hypertrophy  This is a chronic problem. The current episode started 1 to 4 weeks ago. The problem has been waxing and waning since onset. Irritative symptoms include frequency. Irritative symptoms do not include urgency. Obstructive symptoms include a weak stream. Obstructive symptoms do not include dribbling, incomplete emptying, an intermittent stream, a slower stream or straining. Pertinent negatives include no chills, dysuria, genital pain, hematuria, hesitancy, nausea or vomiting. Nothing aggravates the symptoms. The treatment provided mild relief.  Rash  This is a chronic problem. The current episode started more than 1 month ago. The problem has been waxing and waning since onset. The affected locations include the genitalia and groin. The rash is characterized by itchiness. Pertinent negatives include no anorexia, congestion, cough, diarrhea, eye pain, facial edema, fatigue, fever, joint pain, nail changes, rhinorrhea, shortness of breath, sore throat or vomiting. Past treatments include nothing. The treatment provided moderate relief.  Insomnia  Primary symptoms: no fragmented sleep, no sleep disturbance, no difficulty falling asleep, no somnolence, no frequent awakening, no premature morning awakening, no malaise/fatigue, no napping.   The current episode started more than one month. The onset quality is sudden. The problem occurs nightly. The problem has been waxing and waning since onset. PMH includes: no depression.    No problem-specific Assessment & Plan notes found for this encounter.   Past Medical History:  Diagnosis Date  . Anxiety   . BPH (benign prostatic hyperplasia)   . COPD (chronic obstructive pulmonary disease)  (HCC)   . Depression   . GERD (gastroesophageal reflux disease)   . Thyroid disease     Past Surgical History:  Procedure Laterality Date  . CHOLECYSTECTOMY    . COLONOSCOPY  2016   bleeding ulcer  . REPLACEMENT TOTAL KNEE Right   . SHOULDER SURGERY Right    replacement    No family history on file.  Social History   Social History  . Marital status: Married    Spouse name: N/A  . Number of children: N/A  . Years of education: N/A   Occupational History  . Not on file.   Social History Main Topics  . Smoking status: Never Smoker  . Smokeless tobacco: Not on file  . Alcohol use No  . Drug use: No  . Sexual activity: No   Other Topics Concern  . Not on file   Social History Narrative  . No narrative on file    Allergies  Allergen Reactions  . Codeine Sulfate Other (See Comments)     Review of Systems  Constitutional: Negative for chills, fatigue, fever, malaise/fatigue and weight loss.  HENT: Negative for congestion, ear discharge, ear pain, rhinorrhea and sore throat.   Eyes: Negative for blurred vision and pain.  Respiratory: Negative for cough, sputum production, shortness of breath and wheezing.   Cardiovascular: Negative for chest pain, palpitations and leg swelling.  Gastrointestinal: Negative for abdominal pain, anorexia, blood in stool, constipation, diarrhea, heartburn, melena, nausea and vomiting.  Genitourinary: Positive for frequency. Negative for dysuria, hematuria, hesitancy, incomplete emptying and urgency.  Musculoskeletal: Negative for back pain, joint pain, myalgias and neck pain.  Skin: Positive for rash. Negative for nail changes.  Neurological:  Negative for dizziness, tingling, sensory change, focal weakness and headaches.  Endo/Heme/Allergies: Negative for environmental allergies and polydipsia. Does not bruise/bleed easily.  Psychiatric/Behavioral: Negative for depression, sleep disturbance and suicidal ideas. The patient has insomnia.  The patient is not nervous/anxious.      Objective  Vitals:   02/04/16 1354  BP: 120/70  Pulse: 62  SpO2: 97%  Weight: 175 lb (79.4 kg)  Height: 5\' 5"  (1.651 m)    Physical Exam  Constitutional: He is oriented to person, place, and time and well-developed, well-nourished, and in no distress.  HENT:  Head: Normocephalic.  Right Ear: External ear normal.  Left Ear: External ear normal.  Nose: Nose normal.  Mouth/Throat: Oropharynx is clear and moist.  Eyes: Conjunctivae and EOM are normal. Pupils are equal, round, and reactive to light. Right eye exhibits no discharge. Left eye exhibits no discharge. No scleral icterus.  Neck: Normal range of motion. Neck supple. No JVD present. No tracheal deviation present. No thyromegaly present.  Cardiovascular: Normal rate, regular rhythm, normal heart sounds and intact distal pulses.  Exam reveals no gallop and no friction rub.   No murmur heard. Pulmonary/Chest: Breath sounds normal. No respiratory distress. He has no wheezes. He has no rales.  Abdominal: Soft. Bowel sounds are normal. He exhibits no mass. There is no hepatosplenomegaly. There is no tenderness. There is no rebound, no guarding and no CVA tenderness.  Musculoskeletal: Normal range of motion. He exhibits no edema or tenderness.  Lymphadenopathy:    He has no cervical adenopathy.  Neurological: He is alert and oriented to person, place, and time. He has normal sensation, normal strength and intact cranial nerves. No cranial nerve deficit.  Skin: Skin is warm. No rash noted. There is erythema.     Psychiatric: Mood and affect normal.  Nursing note and vitals reviewed.     Assessment & Plan  Problem List Items Addressed This Visit    None    Visit Diagnoses    BPH (benign prostatic hypertrophy)    -  Primary   Relevant Medications   finasteride (PROSCAR) 5 MG tablet   tamsulosin (FLOMAX) 0.4 MG CAPS capsule   Tinea inguinalis       Relevant Medications    fluconazole (DIFLUCAN) 150 MG tablet   clotrimazole-betamethasone (LOTRISONE) cream   Gastroesophageal reflux disease, esophagitis presence not specified       Relevant Medications   pantoprazole (PROTONIX) 40 MG tablet   Hypothyroidism, unspecified hypothyroidism type       Relevant Medications   levothyroxine (SYNTHROID, LEVOTHROID) 50 MCG tablet     25 minutes spent with patient   Dr. Hayden [facility]  02/04/16

## 2016-02-14 ENCOUNTER — Other Ambulatory Visit: Payer: Self-pay

## 2016-02-14 DIAGNOSIS — N400 Enlarged prostate without lower urinary tract symptoms: Secondary | ICD-10-CM

## 2016-02-14 DIAGNOSIS — J9601 Acute respiratory failure with hypoxia: Secondary | ICD-10-CM | POA: Diagnosis not present

## 2016-02-14 MED ORDER — TAMSULOSIN HCL 0.4 MG PO CAPS: 0.4000 mg | ORAL_CAPSULE | Freq: Every day | ORAL | 0 refills | Status: DC

## 2016-02-28 ENCOUNTER — Other Ambulatory Visit: Payer: Self-pay

## 2016-03-15 DIAGNOSIS — J9601 Acute respiratory failure with hypoxia: Secondary | ICD-10-CM | POA: Diagnosis not present

## 2016-04-15 DIAGNOSIS — J9601 Acute respiratory failure with hypoxia: Secondary | ICD-10-CM | POA: Diagnosis not present

## 2016-04-25 DIAGNOSIS — H2513 Age-related nuclear cataract, bilateral: Secondary | ICD-10-CM | POA: Diagnosis not present

## 2016-05-15 DIAGNOSIS — J9601 Acute respiratory failure with hypoxia: Secondary | ICD-10-CM | POA: Diagnosis not present

## 2016-06-15 DIAGNOSIS — J9601 Acute respiratory failure with hypoxia: Secondary | ICD-10-CM | POA: Diagnosis not present

## 2016-06-16 DIAGNOSIS — H401134 Primary open-angle glaucoma, bilateral, indeterminate stage: Secondary | ICD-10-CM | POA: Diagnosis not present

## 2016-06-22 ENCOUNTER — Other Ambulatory Visit: Payer: Self-pay

## 2016-07-16 DIAGNOSIS — J9601 Acute respiratory failure with hypoxia: Secondary | ICD-10-CM | POA: Diagnosis not present

## 2016-07-28 ENCOUNTER — Ambulatory Visit: Payer: Medicare Other | Admitting: Family Medicine

## 2016-07-31 ENCOUNTER — Ambulatory Visit: Payer: Medicare Other | Admitting: Family Medicine

## 2016-08-03 ENCOUNTER — Ambulatory Visit (INDEPENDENT_AMBULATORY_CARE_PROVIDER_SITE_OTHER): Payer: Medicare Other | Admitting: Family Medicine

## 2016-08-03 VITALS — BP 120/72 | HR 70

## 2016-08-03 DIAGNOSIS — R0602 Shortness of breath: Secondary | ICD-10-CM | POA: Diagnosis not present

## 2016-08-03 DIAGNOSIS — G4700 Insomnia, unspecified: Secondary | ICD-10-CM

## 2016-08-03 DIAGNOSIS — E039 Hypothyroidism, unspecified: Secondary | ICD-10-CM

## 2016-08-03 DIAGNOSIS — N401 Enlarged prostate with lower urinary tract symptoms: Secondary | ICD-10-CM

## 2016-08-03 DIAGNOSIS — F419 Anxiety disorder, unspecified: Secondary | ICD-10-CM | POA: Diagnosis not present

## 2016-08-03 DIAGNOSIS — R5383 Other fatigue: Secondary | ICD-10-CM | POA: Diagnosis not present

## 2016-08-03 MED ORDER — FINASTERIDE 5 MG PO TABS: 5.0000 mg | ORAL_TABLET | Freq: Every day | ORAL | 6 refills | Status: AC

## 2016-08-03 MED ORDER — TRAZODONE HCL 50 MG PO TABS: 50.0000 mg | ORAL_TABLET | Freq: Every day | ORAL | 6 refills | Status: DC

## 2016-08-03 MED ORDER — TAMSULOSIN HCL 0.4 MG PO CAPS: 0.4000 mg | ORAL_CAPSULE | Freq: Every day | ORAL | 6 refills | Status: DC

## 2016-08-03 MED ORDER — LEVOTHYROXINE SODIUM 50 MCG PO TABS: 50.0000 ug | ORAL_TABLET | Freq: Every day | ORAL | 6 refills | Status: DC

## 2016-08-03 MED ORDER — TRAZODONE HCL 50 MG PO TABS: 50.0000 mg | ORAL_TABLET | Freq: Two times a day (BID) | ORAL | 6 refills | Status: AC

## 2016-08-03 NOTE — Progress Notes (Signed)
Name: Jamie Fischer   MRN: [id number]    DOB: 12-24-1919   Date:08/03/2016       Progress Note  Subjective  Chief Complaint  Chief Complaint  Patient presents with  . Hypothyroidism  . Benign Prostatic Hypertrophy  . Insomnia  . Sinusitis    "have a lot of plegm in my throat"    Benign Prostatic Hypertrophy  This is a chronic problem. The current episode started more than 1 year ago. The problem has been waxing and waning since onset. Irritative symptoms include frequency. Irritative symptoms do not include urgency. Obstructive symptoms include an intermittent stream and a slower stream. Obstructive symptoms do not include dribbling, incomplete emptying, straining or a weak stream. Pertinent negatives include no chills, dysuria, hematuria or nausea. Nothing aggravates the symptoms. The treatment provided mild relief.  Insomnia  Primary symptoms: difficulty falling asleep, no malaise/fatigue.  The current episode started 1 to 4 weeks ago. The onset quality is gradual. The problem has been waxing and waning since onset. Past treatments include medication. The treatment provided mild relief. PMH includes: no depression, family stress or anxiety, no chronic pain.  Sinusitis  This is a recurrent problem. The current episode started in the past 7 days. The problem has been gradually improving since onset. There has been no fever. Associated symptoms include congestion and shortness of breath. Pertinent negatives include no chills, coughing, diaphoresis, ear pain, headaches, hoarse voice, neck pain, sinus pressure, sneezing, sore throat or swollen glands. Past treatments include nothing. The treatment provided moderate relief.    No problem-specific Assessment & Plan notes found for this encounter.   Past Medical History:  Diagnosis Date  . Anxiety   . BPH (benign prostatic hyperplasia)   . COPD (chronic obstructive pulmonary disease) (HCC)   . Depression   . GERD (gastroesophageal reflux  disease)   . Thyroid disease     Past Surgical History:  Procedure Laterality Date  . CHOLECYSTECTOMY    . COLONOSCOPY  2016   bleeding ulcer  . REPLACEMENT TOTAL KNEE Right   . SHOULDER SURGERY Right    replacement    No family history on file.  Social History   Social History  . Marital status: Married    Spouse name: N/A  . Number of children: N/A  . Years of education: N/A   Occupational History  . Not on file.   Social History Main Topics  . Smoking status: Never Smoker  . Smokeless tobacco: Not on file  . Alcohol use No  . Drug use: No  . Sexual activity: No   Other Topics Concern  . Not on file   Social History Narrative  . No narrative on file    Allergies  Allergen Reactions  . Codeine Sulfate Other (See Comments)    Outpatient Medications Prior to Visit  Medication Sig Dispense Refill  . acetaminophen (TYLENOL) 500 MG tablet Take 500 mg by mouth every 8 (eight) hours as needed.    . clotrimazole-betamethasone (LOTRISONE) cream Apply 1 application topically 2 (two) times daily. 60 g 2  . Multiple Vitamin (MULTI-VITAMINS) TABS Take 1 tablet by mouth daily.    . finasteride (PROSCAR) 5 MG tablet Take 1 tablet (5 mg total) by mouth daily. 30 tablet 6  . levothyroxine (SYNTHROID, LEVOTHROID) 50 MCG tablet Take 1 tablet (50 mcg total) by mouth daily before breakfast. 30 tablet 6  . tamsulosin (FLOMAX) 0.4 MG CAPS capsule Take 1 capsule (0.4 mg total) by mouth daily.  30 capsule 0  . traZODone (DESYREL) 50 MG tablet Take 50 mg by mouth at bedtime.    . pantoprazole (PROTONIX) 40 MG tablet Take 1 tablet (40 mg total) by mouth daily. (Patient not taking: Reported on 08/03/2016) 30 tablet 5  . triamcinolone cream (KENALOG) 0.1 % Apply 1 application topically 2 (two) times daily. (Patient not taking: Reported on 02/04/2016) 30 g 0  . HYDROcodone-acetaminophen (NORCO) 7.5-325 MG tablet Take 1 tablet by mouth every 6 (six) hours as needed for moderate pain.     No  facility-administered medications prior to visit.     Review of Systems  Constitutional: Negative for chills, diaphoresis, fever, malaise/fatigue and weight loss.  HENT: Positive for congestion. Negative for ear discharge, ear pain, hoarse voice, sinus pressure, sneezing and sore throat.   Eyes: Negative for blurred vision.  Respiratory: Positive for shortness of breath. Negative for cough, sputum production and wheezing.   Cardiovascular: Negative for chest pain, palpitations and leg swelling.  Gastrointestinal: Negative for abdominal pain, blood in stool, constipation, diarrhea, heartburn, melena and nausea.  Genitourinary: Positive for frequency. Negative for dysuria, hematuria, incomplete emptying and urgency.  Musculoskeletal: Negative for back pain, joint pain, myalgias and neck pain.  Skin: Negative for rash.  Neurological: Negative for dizziness, tingling, sensory change, focal weakness and headaches.  Endo/Heme/Allergies: Negative for environmental allergies and polydipsia. Does not bruise/bleed easily.  Psychiatric/Behavioral: Negative for depression and suicidal ideas. The patient has insomnia. The patient is not nervous/anxious.      Objective  Vitals:   08/03/16 1553  BP: 120/72  Pulse: 70  SpO2: 96%    Physical Exam  Constitutional: He is oriented to person, place, and time and well-developed, well-nourished, and in no distress.  HENT:  Head: Normocephalic.  Right Ear: External ear normal.  Left Ear: External ear normal.  Nose: Nose normal.  Mouth/Throat: Oropharynx is clear and moist.  Eyes: Conjunctivae and EOM are normal. Pupils are equal, round, and reactive to light. Right eye exhibits no discharge. Left eye exhibits no discharge. No scleral icterus.  Neck: Normal range of motion. Neck supple. No JVD present. No tracheal deviation present. No thyromegaly present.  Cardiovascular: Normal rate, regular rhythm, normal heart sounds and intact distal pulses.  Exam  reveals no gallop and no friction rub.   No murmur heard. Pulmonary/Chest: Breath sounds normal. No respiratory distress. He has no wheezes. He has no rales.  Abdominal: Soft. Bowel sounds are normal. He exhibits no mass. There is no hepatosplenomegaly. There is no tenderness. There is no rebound, no guarding and no CVA tenderness.  Musculoskeletal: Normal range of motion. He exhibits no edema or tenderness.  Lymphadenopathy:    He has no cervical adenopathy.  Neurological: He is alert and oriented to person, place, and time. He has normal sensation, normal strength and intact cranial nerves. No cranial nerve deficit.  Skin: Skin is warm. No rash noted.  Psychiatric: Mood and affect normal.  Nursing note and vitals reviewed.     Assessment & Plan  Problem List Items Addressed This Visit    None    Visit Diagnoses    SOB (shortness of breath)    -  Primary   Relevant Orders   Ambulatory referral to Pulmonology   Anxiety       Relevant Medications   traZODone (DESYREL) 50 MG tablet   Benign prostatic hyperplasia with lower urinary tract symptoms, symptom details unspecified       Relevant Medications   tamsulosin (  FLOMAX) 0.4 MG CAPS capsule   finasteride (PROSCAR) 5 MG tablet   Insomnia, unspecified type       Adult hypothyroidism       Relevant Medications   levothyroxine (SYNTHROID, LEVOTHROID) 50 MCG tablet   Other Relevant Orders   TSH   Fatigue, unspecified type       Relevant Orders   TSH   Renal Function Panel   Hepatic Function Panel (6)   CBC with Differential/Platelet      Meds ordered this encounter  Medications  . DISCONTD: traZODone (DESYREL) 50 MG tablet    Sig: Take 1 tablet (50 mg total) by mouth at bedtime.    Dispense:  30 tablet    Refill:  6  . tamsulosin (FLOMAX) 0.4 MG CAPS capsule    Sig: Take 1 capsule (0.4 mg total) by mouth daily.    Dispense:  30 capsule    Refill:  6  . finasteride (PROSCAR) 5 MG tablet    Sig: Take 1 tablet (5 mg  total) by mouth daily.    Dispense:  30 tablet    Refill:  6  . levothyroxine (SYNTHROID, LEVOTHROID) 50 MCG tablet    Sig: Take 1 tablet (50 mcg total) by mouth daily before breakfast.    Dispense:  30 tablet    Refill:  6  . DISCONTD: traZODone (DESYREL) 50 MG tablet    Sig: Take 1 tablet (50 mg total) by mouth at bedtime.    Dispense:  30 tablet    Refill:  6  . traZODone (DESYREL) 50 MG tablet    Sig: Take 1 tablet (50 mg total) by mouth 2 (two) times daily.    Dispense:  60 tablet    Refill:  6      Dr. Elizabeth Sauer [facility]  08/03/16

## 2016-08-04 ENCOUNTER — Other Ambulatory Visit: Payer: Self-pay

## 2016-08-04 LAB — RENAL FUNCTION PANEL
ALBUMIN: 4.2 g/dL (ref 3.2–4.6)
BUN/Creatinine Ratio: 16 (ref 10–24)
BUN: 13 mg/dL (ref 10–36)
CALCIUM: 9.3 mg/dL (ref 8.6–10.2)
CO2: 35 mmol/L — ABNORMAL HIGH (ref 18–29)
CREATININE: 0.82 mg/dL (ref 0.76–1.27)
Chloride: 89 mmol/L — ABNORMAL LOW (ref 96–106)
GFR calc Af Amer: 88 mL/min/{1.73_m2} (ref >59)
GFR, EST NON AFRICAN AMERICAN: 76 mL/min/{1.73_m2} (ref >59)
Glucose: 99 mg/dL (ref 65–99)
PHOSPHORUS: 3.4 mg/dL (ref 2.5–4.5)
Potassium: 5 mmol/L (ref 3.5–5.2)
SODIUM: 133 mmol/L — AB (ref 134–144)

## 2016-08-04 LAB — HEPATIC FUNCTION PANEL (6)
ALK PHOS: 31 IU/L — AB (ref 39–117)
ALT: 9 IU/L (ref 0–44)
AST: 13 IU/L (ref 0–40)
BILIRUBIN TOTAL: 0.7 mg/dL (ref 0.0–1.2)
Bilirubin, Direct: 0.19 mg/dL (ref 0.00–0.40)

## 2016-08-04 LAB — CBC WITH DIFFERENTIAL/PLATELET
BASOS ABS: 0 10*3/uL (ref 0.0–0.2)
Basos: 0 %
EOS (ABSOLUTE): 0.2 10*3/uL (ref 0.0–0.4)
Eos: 3 %
Hematocrit: 36 % — ABNORMAL LOW (ref 37.5–51.0)
Hemoglobin: 11.5 g/dL — ABNORMAL LOW (ref 13.0–17.7)
Immature Grans (Abs): 0 10*3/uL (ref 0.0–0.1)
Immature Granulocytes: 0 %
LYMPHS ABS: 1.6 10*3/uL (ref 0.7–3.1)
Lymphs: 27 %
MCH: 32.8 pg (ref 26.6–33.0)
MCHC: 31.9 g/dL (ref 31.5–35.7)
MCV: 103 fL — ABNORMAL HIGH (ref 79–97)
MONOS ABS: 0.6 10*3/uL (ref 0.1–0.9)
Monocytes: 10 %
NEUTROS ABS: 3.5 10*3/uL (ref 1.4–7.0)
Neutrophils: 60 %
Platelets: 205 10*3/uL (ref 150–379)
RBC: 3.51 x10E6/uL — AB (ref 4.14–5.80)
RDW: 13.1 % (ref 12.3–15.4)
WBC: 5.8 10*3/uL (ref 3.4–10.8)

## 2016-08-04 LAB — TSH: TSH: 1.59 u[IU]/mL (ref 0.450–4.500)

## 2016-08-11 ENCOUNTER — Other Ambulatory Visit: Payer: Self-pay

## 2016-08-11 DIAGNOSIS — J441 Chronic obstructive pulmonary disease with (acute) exacerbation: Secondary | ICD-10-CM

## 2016-08-11 MED ORDER — ALBUTEROL SULFATE (2.5 MG/3ML) 0.083% IN NEBU: 2.5000 mg | INHALATION_SOLUTION | Freq: Two times a day (BID) | RESPIRATORY_TRACT | 5 refills | Status: AC

## 2016-08-13 DIAGNOSIS — J9601 Acute respiratory failure with hypoxia: Secondary | ICD-10-CM | POA: Diagnosis not present

## 2016-09-05 DIAGNOSIS — H401134 Primary open-angle glaucoma, bilateral, indeterminate stage: Secondary | ICD-10-CM | POA: Diagnosis not present

## 2016-09-13 DIAGNOSIS — J9601 Acute respiratory failure with hypoxia: Secondary | ICD-10-CM | POA: Diagnosis not present

## 2016-09-13 DIAGNOSIS — R0902 Hypoxemia: Secondary | ICD-10-CM | POA: Diagnosis not present

## 2016-09-13 DIAGNOSIS — R0609 Other forms of dyspnea: Secondary | ICD-10-CM | POA: Diagnosis not present

## 2016-10-09 ENCOUNTER — Other Ambulatory Visit: Payer: Self-pay

## 2016-10-12 ENCOUNTER — Other Ambulatory Visit (INDEPENDENT_AMBULATORY_CARE_PROVIDER_SITE_OTHER): Payer: Medicare Other

## 2016-10-12 DIAGNOSIS — R350 Frequency of micturition: Secondary | ICD-10-CM

## 2016-10-12 DIAGNOSIS — R0609 Other forms of dyspnea: Secondary | ICD-10-CM | POA: Diagnosis not present

## 2016-10-12 DIAGNOSIS — I2510 Atherosclerotic heart disease of native coronary artery without angina pectoris: Secondary | ICD-10-CM | POA: Diagnosis not present

## 2016-10-12 DIAGNOSIS — R0902 Hypoxemia: Secondary | ICD-10-CM | POA: Diagnosis not present

## 2016-10-12 DIAGNOSIS — G4733 Obstructive sleep apnea (adult) (pediatric): Secondary | ICD-10-CM | POA: Diagnosis not present

## 2016-10-12 LAB — POCT URINALYSIS DIPSTICK
Bilirubin, UA: NEGATIVE
GLUCOSE UA: NEGATIVE
KETONES UA: NEGATIVE
LEUKOCYTES UA: NEGATIVE
NITRITE UA: NEGATIVE
Protein, UA: NEGATIVE
RBC UA: NEGATIVE
Spec Grav, UA: 1.02 (ref 1.010–1.025)
Urobilinogen, UA: 0.2 E.U./dL
pH, UA: 5 (ref 5.0–8.0)

## 2016-10-13 DIAGNOSIS — J9601 Acute respiratory failure with hypoxia: Secondary | ICD-10-CM | POA: Diagnosis not present

## 2016-11-12 DIAGNOSIS — R0902 Hypoxemia: Secondary | ICD-10-CM | POA: Diagnosis not present

## 2016-11-12 DIAGNOSIS — R0609 Other forms of dyspnea: Secondary | ICD-10-CM | POA: Diagnosis not present

## 2016-11-12 DIAGNOSIS — I2510 Atherosclerotic heart disease of native coronary artery without angina pectoris: Secondary | ICD-10-CM | POA: Diagnosis not present

## 2016-11-12 DIAGNOSIS — G4733 Obstructive sleep apnea (adult) (pediatric): Secondary | ICD-10-CM | POA: Diagnosis not present

## 2016-12-12 DIAGNOSIS — G4733 Obstructive sleep apnea (adult) (pediatric): Secondary | ICD-10-CM | POA: Diagnosis not present

## 2016-12-12 DIAGNOSIS — R0902 Hypoxemia: Secondary | ICD-10-CM | POA: Diagnosis not present

## 2016-12-12 DIAGNOSIS — R0609 Other forms of dyspnea: Secondary | ICD-10-CM | POA: Diagnosis not present

## 2016-12-12 DIAGNOSIS — I2510 Atherosclerotic heart disease of native coronary artery without angina pectoris: Secondary | ICD-10-CM | POA: Diagnosis not present

## 2017-01-12 ENCOUNTER — Other Ambulatory Visit: Payer: Self-pay

## 2017-01-12 DIAGNOSIS — G4733 Obstructive sleep apnea (adult) (pediatric): Secondary | ICD-10-CM | POA: Diagnosis not present

## 2017-01-12 DIAGNOSIS — I2510 Atherosclerotic heart disease of native coronary artery without angina pectoris: Secondary | ICD-10-CM | POA: Diagnosis not present

## 2017-01-12 DIAGNOSIS — R0902 Hypoxemia: Secondary | ICD-10-CM | POA: Diagnosis not present

## 2017-01-12 DIAGNOSIS — R0609 Other forms of dyspnea: Secondary | ICD-10-CM | POA: Diagnosis not present

## 2017-02-06 ENCOUNTER — Other Ambulatory Visit: Payer: Self-pay | Admitting: Family Medicine

## 2017-02-06 DIAGNOSIS — E039 Hypothyroidism, unspecified: Secondary | ICD-10-CM

## 2017-02-12 DIAGNOSIS — G4733 Obstructive sleep apnea (adult) (pediatric): Secondary | ICD-10-CM | POA: Diagnosis not present

## 2017-02-12 DIAGNOSIS — R0609 Other forms of dyspnea: Secondary | ICD-10-CM | POA: Diagnosis not present

## 2017-02-12 DIAGNOSIS — I2510 Atherosclerotic heart disease of native coronary artery without angina pectoris: Secondary | ICD-10-CM | POA: Diagnosis not present

## 2017-02-12 DIAGNOSIS — R0902 Hypoxemia: Secondary | ICD-10-CM | POA: Diagnosis not present

## 2017-02-21 DIAGNOSIS — Z8673 Personal history of transient ischemic attack (TIA), and cerebral infarction without residual deficits: Secondary | ICD-10-CM | POA: Diagnosis not present

## 2017-02-21 DIAGNOSIS — G8929 Other chronic pain: Secondary | ICD-10-CM | POA: Diagnosis not present

## 2017-02-21 DIAGNOSIS — R410 Disorientation, unspecified: Secondary | ICD-10-CM | POA: Diagnosis not present

## 2017-02-21 DIAGNOSIS — Z23 Encounter for immunization: Secondary | ICD-10-CM | POA: Diagnosis not present

## 2017-02-21 DIAGNOSIS — E039 Hypothyroidism, unspecified: Secondary | ICD-10-CM | POA: Diagnosis not present

## 2017-02-21 DIAGNOSIS — R338 Other retention of urine: Secondary | ICD-10-CM | POA: Diagnosis not present

## 2017-02-21 DIAGNOSIS — R3914 Feeling of incomplete bladder emptying: Secondary | ICD-10-CM | POA: Diagnosis not present

## 2017-02-21 DIAGNOSIS — R1310 Dysphagia, unspecified: Secondary | ICD-10-CM | POA: Diagnosis not present

## 2017-02-21 DIAGNOSIS — R350 Frequency of micturition: Secondary | ICD-10-CM | POA: Diagnosis not present

## 2017-02-21 DIAGNOSIS — Z79899 Other long term (current) drug therapy: Secondary | ICD-10-CM | POA: Diagnosis not present

## 2017-02-21 DIAGNOSIS — R52 Pain, unspecified: Secondary | ICD-10-CM | POA: Diagnosis not present

## 2017-02-21 DIAGNOSIS — J9811 Atelectasis: Secondary | ICD-10-CM | POA: Diagnosis not present

## 2017-02-21 DIAGNOSIS — R0602 Shortness of breath: Secondary | ICD-10-CM | POA: Diagnosis not present

## 2017-02-21 DIAGNOSIS — R200 Anesthesia of skin: Secondary | ICD-10-CM | POA: Diagnosis not present

## 2017-02-21 DIAGNOSIS — Z885 Allergy status to narcotic agent status: Secondary | ICD-10-CM | POA: Diagnosis not present

## 2017-02-21 DIAGNOSIS — R0989 Other specified symptoms and signs involving the circulatory and respiratory systems: Secondary | ICD-10-CM | POA: Diagnosis not present

## 2017-02-21 DIAGNOSIS — R69 Illness, unspecified: Secondary | ICD-10-CM | POA: Diagnosis not present

## 2017-02-21 DIAGNOSIS — I252 Old myocardial infarction: Secondary | ICD-10-CM | POA: Diagnosis not present

## 2017-02-21 DIAGNOSIS — N401 Enlarged prostate with lower urinary tract symptoms: Secondary | ICD-10-CM | POA: Diagnosis not present

## 2017-02-21 DIAGNOSIS — R5383 Other fatigue: Secondary | ICD-10-CM | POA: Diagnosis not present

## 2017-02-21 DIAGNOSIS — G4700 Insomnia, unspecified: Secondary | ICD-10-CM | POA: Diagnosis not present

## 2017-02-21 DIAGNOSIS — N3001 Acute cystitis with hematuria: Secondary | ICD-10-CM | POA: Diagnosis not present

## 2017-02-21 DIAGNOSIS — N481 Balanitis: Secondary | ICD-10-CM | POA: Diagnosis not present

## 2017-02-21 DIAGNOSIS — R531 Weakness: Secondary | ICD-10-CM | POA: Diagnosis not present

## 2017-02-21 DIAGNOSIS — I69359 Hemiplegia and hemiparesis following cerebral infarction affecting unspecified side: Secondary | ICD-10-CM | POA: Diagnosis not present

## 2017-02-21 DIAGNOSIS — Z7982 Long term (current) use of aspirin: Secondary | ICD-10-CM | POA: Diagnosis not present

## 2017-02-21 DIAGNOSIS — I2510 Atherosclerotic heart disease of native coronary artery without angina pectoris: Secondary | ICD-10-CM | POA: Diagnosis not present

## 2017-02-21 DIAGNOSIS — R110 Nausea: Secondary | ICD-10-CM | POA: Diagnosis not present

## 2017-02-22 DIAGNOSIS — N481 Balanitis: Secondary | ICD-10-CM | POA: Diagnosis not present

## 2017-02-22 DIAGNOSIS — R531 Weakness: Secondary | ICD-10-CM | POA: Diagnosis not present

## 2017-02-22 DIAGNOSIS — R5383 Other fatigue: Secondary | ICD-10-CM | POA: Diagnosis not present

## 2017-02-22 DIAGNOSIS — E039 Hypothyroidism, unspecified: Secondary | ICD-10-CM | POA: Diagnosis not present

## 2017-02-28 ENCOUNTER — Other Ambulatory Visit: Payer: Self-pay | Admitting: Family Medicine

## 2017-02-28 DIAGNOSIS — R0602 Shortness of breath: Secondary | ICD-10-CM | POA: Diagnosis not present

## 2017-02-28 DIAGNOSIS — R531 Weakness: Secondary | ICD-10-CM | POA: Diagnosis not present

## 2017-02-28 DIAGNOSIS — K5900 Constipation, unspecified: Secondary | ICD-10-CM | POA: Diagnosis not present

## 2017-03-03 DIAGNOSIS — J449 Chronic obstructive pulmonary disease, unspecified: Secondary | ICD-10-CM | POA: Diagnosis not present

## 2017-03-03 DIAGNOSIS — J984 Other disorders of lung: Secondary | ICD-10-CM | POA: Diagnosis not present

## 2017-03-03 DIAGNOSIS — G8929 Other chronic pain: Secondary | ICD-10-CM | POA: Diagnosis not present

## 2017-03-03 DIAGNOSIS — N401 Enlarged prostate with lower urinary tract symptoms: Secondary | ICD-10-CM | POA: Diagnosis not present

## 2017-03-03 DIAGNOSIS — Z87440 Personal history of urinary (tract) infections: Secondary | ICD-10-CM | POA: Diagnosis not present

## 2017-03-03 DIAGNOSIS — N481 Balanitis: Secondary | ICD-10-CM | POA: Diagnosis not present

## 2017-03-03 DIAGNOSIS — I2510 Atherosclerotic heart disease of native coronary artery without angina pectoris: Secondary | ICD-10-CM | POA: Diagnosis not present

## 2017-03-03 DIAGNOSIS — I69354 Hemiplegia and hemiparesis following cerebral infarction affecting left non-dominant side: Secondary | ICD-10-CM | POA: Diagnosis not present

## 2017-03-03 DIAGNOSIS — G4700 Insomnia, unspecified: Secondary | ICD-10-CM | POA: Diagnosis not present

## 2017-03-03 DIAGNOSIS — K5900 Constipation, unspecified: Secondary | ICD-10-CM | POA: Diagnosis not present

## 2017-03-03 DIAGNOSIS — R338 Other retention of urine: Secondary | ICD-10-CM | POA: Diagnosis not present

## 2017-03-03 DIAGNOSIS — G4730 Sleep apnea, unspecified: Secondary | ICD-10-CM | POA: Diagnosis not present

## 2017-03-03 DIAGNOSIS — R1310 Dysphagia, unspecified: Secondary | ICD-10-CM | POA: Diagnosis not present

## 2017-03-03 DIAGNOSIS — Z9981 Dependence on supplemental oxygen: Secondary | ICD-10-CM | POA: Diagnosis not present

## 2017-03-05 DIAGNOSIS — J984 Other disorders of lung: Secondary | ICD-10-CM | POA: Diagnosis not present

## 2017-03-05 DIAGNOSIS — K5900 Constipation, unspecified: Secondary | ICD-10-CM | POA: Diagnosis not present

## 2017-03-05 DIAGNOSIS — G4700 Insomnia, unspecified: Secondary | ICD-10-CM | POA: Diagnosis not present

## 2017-03-05 DIAGNOSIS — R1310 Dysphagia, unspecified: Secondary | ICD-10-CM | POA: Diagnosis not present

## 2017-03-05 DIAGNOSIS — R338 Other retention of urine: Secondary | ICD-10-CM | POA: Diagnosis not present

## 2017-03-05 DIAGNOSIS — Z9981 Dependence on supplemental oxygen: Secondary | ICD-10-CM | POA: Diagnosis not present

## 2017-03-05 DIAGNOSIS — G8929 Other chronic pain: Secondary | ICD-10-CM | POA: Diagnosis not present

## 2017-03-05 DIAGNOSIS — N401 Enlarged prostate with lower urinary tract symptoms: Secondary | ICD-10-CM | POA: Diagnosis not present

## 2017-03-05 DIAGNOSIS — N481 Balanitis: Secondary | ICD-10-CM | POA: Diagnosis not present

## 2017-03-05 DIAGNOSIS — Z87440 Personal history of urinary (tract) infections: Secondary | ICD-10-CM | POA: Diagnosis not present

## 2017-03-05 DIAGNOSIS — J449 Chronic obstructive pulmonary disease, unspecified: Secondary | ICD-10-CM | POA: Diagnosis not present

## 2017-03-05 DIAGNOSIS — I69354 Hemiplegia and hemiparesis following cerebral infarction affecting left non-dominant side: Secondary | ICD-10-CM | POA: Diagnosis not present

## 2017-03-05 DIAGNOSIS — G4730 Sleep apnea, unspecified: Secondary | ICD-10-CM | POA: Diagnosis not present

## 2017-03-05 DIAGNOSIS — I2510 Atherosclerotic heart disease of native coronary artery without angina pectoris: Secondary | ICD-10-CM | POA: Diagnosis not present

## 2017-03-06 DIAGNOSIS — G4700 Insomnia, unspecified: Secondary | ICD-10-CM | POA: Diagnosis not present

## 2017-03-06 DIAGNOSIS — G4730 Sleep apnea, unspecified: Secondary | ICD-10-CM | POA: Diagnosis not present

## 2017-03-06 DIAGNOSIS — N481 Balanitis: Secondary | ICD-10-CM | POA: Diagnosis not present

## 2017-03-06 DIAGNOSIS — I69354 Hemiplegia and hemiparesis following cerebral infarction affecting left non-dominant side: Secondary | ICD-10-CM | POA: Diagnosis not present

## 2017-03-06 DIAGNOSIS — Z87440 Personal history of urinary (tract) infections: Secondary | ICD-10-CM | POA: Diagnosis not present

## 2017-03-06 DIAGNOSIS — J984 Other disorders of lung: Secondary | ICD-10-CM | POA: Diagnosis not present

## 2017-03-06 DIAGNOSIS — K5900 Constipation, unspecified: Secondary | ICD-10-CM | POA: Diagnosis not present

## 2017-03-06 DIAGNOSIS — R1310 Dysphagia, unspecified: Secondary | ICD-10-CM | POA: Diagnosis not present

## 2017-03-06 DIAGNOSIS — Z9981 Dependence on supplemental oxygen: Secondary | ICD-10-CM | POA: Diagnosis not present

## 2017-03-06 DIAGNOSIS — J449 Chronic obstructive pulmonary disease, unspecified: Secondary | ICD-10-CM | POA: Diagnosis not present

## 2017-03-06 DIAGNOSIS — R338 Other retention of urine: Secondary | ICD-10-CM | POA: Diagnosis not present

## 2017-03-06 DIAGNOSIS — I2510 Atherosclerotic heart disease of native coronary artery without angina pectoris: Secondary | ICD-10-CM | POA: Diagnosis not present

## 2017-03-06 DIAGNOSIS — G8929 Other chronic pain: Secondary | ICD-10-CM | POA: Diagnosis not present

## 2017-03-06 DIAGNOSIS — N401 Enlarged prostate with lower urinary tract symptoms: Secondary | ICD-10-CM | POA: Diagnosis not present

## 2017-03-07 DIAGNOSIS — I69354 Hemiplegia and hemiparesis following cerebral infarction affecting left non-dominant side: Secondary | ICD-10-CM | POA: Diagnosis not present

## 2017-03-07 DIAGNOSIS — K5900 Constipation, unspecified: Secondary | ICD-10-CM | POA: Diagnosis not present

## 2017-03-07 DIAGNOSIS — G8929 Other chronic pain: Secondary | ICD-10-CM | POA: Diagnosis not present

## 2017-03-07 DIAGNOSIS — G4730 Sleep apnea, unspecified: Secondary | ICD-10-CM | POA: Diagnosis not present

## 2017-03-07 DIAGNOSIS — I2510 Atherosclerotic heart disease of native coronary artery without angina pectoris: Secondary | ICD-10-CM | POA: Diagnosis not present

## 2017-03-07 DIAGNOSIS — N481 Balanitis: Secondary | ICD-10-CM | POA: Diagnosis not present

## 2017-03-07 DIAGNOSIS — G4700 Insomnia, unspecified: Secondary | ICD-10-CM | POA: Diagnosis not present

## 2017-03-07 DIAGNOSIS — Z87440 Personal history of urinary (tract) infections: Secondary | ICD-10-CM | POA: Diagnosis not present

## 2017-03-07 DIAGNOSIS — Z9981 Dependence on supplemental oxygen: Secondary | ICD-10-CM | POA: Diagnosis not present

## 2017-03-07 DIAGNOSIS — J449 Chronic obstructive pulmonary disease, unspecified: Secondary | ICD-10-CM | POA: Diagnosis not present

## 2017-03-07 DIAGNOSIS — N401 Enlarged prostate with lower urinary tract symptoms: Secondary | ICD-10-CM | POA: Diagnosis not present

## 2017-03-07 DIAGNOSIS — J984 Other disorders of lung: Secondary | ICD-10-CM | POA: Diagnosis not present

## 2017-03-07 DIAGNOSIS — R338 Other retention of urine: Secondary | ICD-10-CM | POA: Diagnosis not present

## 2017-03-07 DIAGNOSIS — R1310 Dysphagia, unspecified: Secondary | ICD-10-CM | POA: Diagnosis not present

## 2017-03-08 DIAGNOSIS — G4730 Sleep apnea, unspecified: Secondary | ICD-10-CM | POA: Diagnosis not present

## 2017-03-08 DIAGNOSIS — R338 Other retention of urine: Secondary | ICD-10-CM | POA: Diagnosis not present

## 2017-03-08 DIAGNOSIS — N401 Enlarged prostate with lower urinary tract symptoms: Secondary | ICD-10-CM | POA: Diagnosis not present

## 2017-03-08 DIAGNOSIS — G8929 Other chronic pain: Secondary | ICD-10-CM | POA: Diagnosis not present

## 2017-03-08 DIAGNOSIS — Z87440 Personal history of urinary (tract) infections: Secondary | ICD-10-CM | POA: Diagnosis not present

## 2017-03-08 DIAGNOSIS — J984 Other disorders of lung: Secondary | ICD-10-CM | POA: Diagnosis not present

## 2017-03-08 DIAGNOSIS — I2510 Atherosclerotic heart disease of native coronary artery without angina pectoris: Secondary | ICD-10-CM | POA: Diagnosis not present

## 2017-03-08 DIAGNOSIS — R1310 Dysphagia, unspecified: Secondary | ICD-10-CM | POA: Diagnosis not present

## 2017-03-08 DIAGNOSIS — Z9981 Dependence on supplemental oxygen: Secondary | ICD-10-CM | POA: Diagnosis not present

## 2017-03-08 DIAGNOSIS — N481 Balanitis: Secondary | ICD-10-CM | POA: Diagnosis not present

## 2017-03-08 DIAGNOSIS — K5900 Constipation, unspecified: Secondary | ICD-10-CM | POA: Diagnosis not present

## 2017-03-08 DIAGNOSIS — J449 Chronic obstructive pulmonary disease, unspecified: Secondary | ICD-10-CM | POA: Diagnosis not present

## 2017-03-08 DIAGNOSIS — I69354 Hemiplegia and hemiparesis following cerebral infarction affecting left non-dominant side: Secondary | ICD-10-CM | POA: Diagnosis not present

## 2017-03-08 DIAGNOSIS — G4700 Insomnia, unspecified: Secondary | ICD-10-CM | POA: Diagnosis not present

## 2017-03-12 DIAGNOSIS — G4730 Sleep apnea, unspecified: Secondary | ICD-10-CM | POA: Diagnosis not present

## 2017-03-12 DIAGNOSIS — R338 Other retention of urine: Secondary | ICD-10-CM | POA: Diagnosis not present

## 2017-03-12 DIAGNOSIS — J449 Chronic obstructive pulmonary disease, unspecified: Secondary | ICD-10-CM | POA: Diagnosis not present

## 2017-03-12 DIAGNOSIS — Z87440 Personal history of urinary (tract) infections: Secondary | ICD-10-CM | POA: Diagnosis not present

## 2017-03-12 DIAGNOSIS — G8929 Other chronic pain: Secondary | ICD-10-CM | POA: Diagnosis not present

## 2017-03-12 DIAGNOSIS — J984 Other disorders of lung: Secondary | ICD-10-CM | POA: Diagnosis not present

## 2017-03-12 DIAGNOSIS — G4700 Insomnia, unspecified: Secondary | ICD-10-CM | POA: Diagnosis not present

## 2017-03-12 DIAGNOSIS — I2510 Atherosclerotic heart disease of native coronary artery without angina pectoris: Secondary | ICD-10-CM | POA: Diagnosis not present

## 2017-03-12 DIAGNOSIS — N481 Balanitis: Secondary | ICD-10-CM | POA: Diagnosis not present

## 2017-03-12 DIAGNOSIS — Z9981 Dependence on supplemental oxygen: Secondary | ICD-10-CM | POA: Diagnosis not present

## 2017-03-12 DIAGNOSIS — R1310 Dysphagia, unspecified: Secondary | ICD-10-CM | POA: Diagnosis not present

## 2017-03-12 DIAGNOSIS — N401 Enlarged prostate with lower urinary tract symptoms: Secondary | ICD-10-CM | POA: Diagnosis not present

## 2017-03-12 DIAGNOSIS — I69354 Hemiplegia and hemiparesis following cerebral infarction affecting left non-dominant side: Secondary | ICD-10-CM | POA: Diagnosis not present

## 2017-03-12 DIAGNOSIS — K5900 Constipation, unspecified: Secondary | ICD-10-CM | POA: Diagnosis not present

## 2017-03-13 DIAGNOSIS — N401 Enlarged prostate with lower urinary tract symptoms: Secondary | ICD-10-CM | POA: Diagnosis not present

## 2017-03-13 DIAGNOSIS — R338 Other retention of urine: Secondary | ICD-10-CM | POA: Diagnosis not present

## 2017-03-13 DIAGNOSIS — J449 Chronic obstructive pulmonary disease, unspecified: Secondary | ICD-10-CM | POA: Diagnosis not present

## 2017-03-13 DIAGNOSIS — G4700 Insomnia, unspecified: Secondary | ICD-10-CM | POA: Diagnosis not present

## 2017-03-13 DIAGNOSIS — J984 Other disorders of lung: Secondary | ICD-10-CM | POA: Diagnosis not present

## 2017-03-13 DIAGNOSIS — G4730 Sleep apnea, unspecified: Secondary | ICD-10-CM | POA: Diagnosis not present

## 2017-03-13 DIAGNOSIS — Z87440 Personal history of urinary (tract) infections: Secondary | ICD-10-CM | POA: Diagnosis not present

## 2017-03-13 DIAGNOSIS — I2510 Atherosclerotic heart disease of native coronary artery without angina pectoris: Secondary | ICD-10-CM | POA: Diagnosis not present

## 2017-03-13 DIAGNOSIS — R1310 Dysphagia, unspecified: Secondary | ICD-10-CM | POA: Diagnosis not present

## 2017-03-13 DIAGNOSIS — G8929 Other chronic pain: Secondary | ICD-10-CM | POA: Diagnosis not present

## 2017-03-13 DIAGNOSIS — Z9981 Dependence on supplemental oxygen: Secondary | ICD-10-CM | POA: Diagnosis not present

## 2017-03-13 DIAGNOSIS — K5900 Constipation, unspecified: Secondary | ICD-10-CM | POA: Diagnosis not present

## 2017-03-13 DIAGNOSIS — N481 Balanitis: Secondary | ICD-10-CM | POA: Diagnosis not present

## 2017-03-13 DIAGNOSIS — I69354 Hemiplegia and hemiparesis following cerebral infarction affecting left non-dominant side: Secondary | ICD-10-CM | POA: Diagnosis not present

## 2017-03-14 DIAGNOSIS — Z9981 Dependence on supplemental oxygen: Secondary | ICD-10-CM | POA: Diagnosis not present

## 2017-03-14 DIAGNOSIS — J984 Other disorders of lung: Secondary | ICD-10-CM | POA: Diagnosis not present

## 2017-03-14 DIAGNOSIS — N401 Enlarged prostate with lower urinary tract symptoms: Secondary | ICD-10-CM | POA: Diagnosis not present

## 2017-03-14 DIAGNOSIS — Z87440 Personal history of urinary (tract) infections: Secondary | ICD-10-CM | POA: Diagnosis not present

## 2017-03-14 DIAGNOSIS — K5900 Constipation, unspecified: Secondary | ICD-10-CM | POA: Diagnosis not present

## 2017-03-14 DIAGNOSIS — G4730 Sleep apnea, unspecified: Secondary | ICD-10-CM | POA: Diagnosis not present

## 2017-03-14 DIAGNOSIS — R0902 Hypoxemia: Secondary | ICD-10-CM | POA: Diagnosis not present

## 2017-03-14 DIAGNOSIS — G4733 Obstructive sleep apnea (adult) (pediatric): Secondary | ICD-10-CM | POA: Diagnosis not present

## 2017-03-14 DIAGNOSIS — I69354 Hemiplegia and hemiparesis following cerebral infarction affecting left non-dominant side: Secondary | ICD-10-CM | POA: Diagnosis not present

## 2017-03-14 DIAGNOSIS — I2510 Atherosclerotic heart disease of native coronary artery without angina pectoris: Secondary | ICD-10-CM | POA: Diagnosis not present

## 2017-03-14 DIAGNOSIS — R0609 Other forms of dyspnea: Secondary | ICD-10-CM | POA: Diagnosis not present

## 2017-03-14 DIAGNOSIS — G8929 Other chronic pain: Secondary | ICD-10-CM | POA: Diagnosis not present

## 2017-03-14 DIAGNOSIS — J449 Chronic obstructive pulmonary disease, unspecified: Secondary | ICD-10-CM | POA: Diagnosis not present

## 2017-03-14 DIAGNOSIS — G4700 Insomnia, unspecified: Secondary | ICD-10-CM | POA: Diagnosis not present

## 2017-03-14 DIAGNOSIS — N481 Balanitis: Secondary | ICD-10-CM | POA: Diagnosis not present

## 2017-03-14 DIAGNOSIS — R1310 Dysphagia, unspecified: Secondary | ICD-10-CM | POA: Diagnosis not present

## 2017-03-14 DIAGNOSIS — R338 Other retention of urine: Secondary | ICD-10-CM | POA: Diagnosis not present

## 2017-03-16 DIAGNOSIS — J984 Other disorders of lung: Secondary | ICD-10-CM | POA: Diagnosis not present

## 2017-03-16 DIAGNOSIS — N481 Balanitis: Secondary | ICD-10-CM | POA: Diagnosis not present

## 2017-03-16 DIAGNOSIS — J449 Chronic obstructive pulmonary disease, unspecified: Secondary | ICD-10-CM | POA: Diagnosis not present

## 2017-03-16 DIAGNOSIS — N401 Enlarged prostate with lower urinary tract symptoms: Secondary | ICD-10-CM | POA: Diagnosis not present

## 2017-03-16 DIAGNOSIS — R1310 Dysphagia, unspecified: Secondary | ICD-10-CM | POA: Diagnosis not present

## 2017-03-16 DIAGNOSIS — G4730 Sleep apnea, unspecified: Secondary | ICD-10-CM | POA: Diagnosis not present

## 2017-03-16 DIAGNOSIS — Z9981 Dependence on supplemental oxygen: Secondary | ICD-10-CM | POA: Diagnosis not present

## 2017-03-16 DIAGNOSIS — Z87440 Personal history of urinary (tract) infections: Secondary | ICD-10-CM | POA: Diagnosis not present

## 2017-03-16 DIAGNOSIS — R338 Other retention of urine: Secondary | ICD-10-CM | POA: Diagnosis not present

## 2017-03-16 DIAGNOSIS — G8929 Other chronic pain: Secondary | ICD-10-CM | POA: Diagnosis not present

## 2017-03-16 DIAGNOSIS — I2510 Atherosclerotic heart disease of native coronary artery without angina pectoris: Secondary | ICD-10-CM | POA: Diagnosis not present

## 2017-03-16 DIAGNOSIS — I69354 Hemiplegia and hemiparesis following cerebral infarction affecting left non-dominant side: Secondary | ICD-10-CM | POA: Diagnosis not present

## 2017-03-16 DIAGNOSIS — K5900 Constipation, unspecified: Secondary | ICD-10-CM | POA: Diagnosis not present

## 2017-03-16 DIAGNOSIS — G4700 Insomnia, unspecified: Secondary | ICD-10-CM | POA: Diagnosis not present

## 2017-03-20 DIAGNOSIS — I2510 Atherosclerotic heart disease of native coronary artery without angina pectoris: Secondary | ICD-10-CM | POA: Diagnosis not present

## 2017-03-20 DIAGNOSIS — R338 Other retention of urine: Secondary | ICD-10-CM | POA: Diagnosis not present

## 2017-03-20 DIAGNOSIS — J984 Other disorders of lung: Secondary | ICD-10-CM | POA: Diagnosis not present

## 2017-03-20 DIAGNOSIS — Z87440 Personal history of urinary (tract) infections: Secondary | ICD-10-CM | POA: Diagnosis not present

## 2017-03-20 DIAGNOSIS — R1310 Dysphagia, unspecified: Secondary | ICD-10-CM | POA: Diagnosis not present

## 2017-03-20 DIAGNOSIS — I69354 Hemiplegia and hemiparesis following cerebral infarction affecting left non-dominant side: Secondary | ICD-10-CM | POA: Diagnosis not present

## 2017-03-20 DIAGNOSIS — N401 Enlarged prostate with lower urinary tract symptoms: Secondary | ICD-10-CM | POA: Diagnosis not present

## 2017-03-20 DIAGNOSIS — Z9981 Dependence on supplemental oxygen: Secondary | ICD-10-CM | POA: Diagnosis not present

## 2017-03-20 DIAGNOSIS — N481 Balanitis: Secondary | ICD-10-CM | POA: Diagnosis not present

## 2017-03-20 DIAGNOSIS — G4700 Insomnia, unspecified: Secondary | ICD-10-CM | POA: Diagnosis not present

## 2017-03-20 DIAGNOSIS — G4730 Sleep apnea, unspecified: Secondary | ICD-10-CM | POA: Diagnosis not present

## 2017-03-20 DIAGNOSIS — K5900 Constipation, unspecified: Secondary | ICD-10-CM | POA: Diagnosis not present

## 2017-03-20 DIAGNOSIS — G8929 Other chronic pain: Secondary | ICD-10-CM | POA: Diagnosis not present

## 2017-03-20 DIAGNOSIS — J449 Chronic obstructive pulmonary disease, unspecified: Secondary | ICD-10-CM | POA: Diagnosis not present

## 2017-03-22 ENCOUNTER — Telehealth: Payer: Self-pay

## 2017-03-22 DIAGNOSIS — N481 Balanitis: Secondary | ICD-10-CM | POA: Diagnosis not present

## 2017-03-22 DIAGNOSIS — I69354 Hemiplegia and hemiparesis following cerebral infarction affecting left non-dominant side: Secondary | ICD-10-CM | POA: Diagnosis not present

## 2017-03-22 DIAGNOSIS — J449 Chronic obstructive pulmonary disease, unspecified: Secondary | ICD-10-CM | POA: Diagnosis not present

## 2017-03-22 DIAGNOSIS — R338 Other retention of urine: Secondary | ICD-10-CM | POA: Diagnosis not present

## 2017-03-22 DIAGNOSIS — G8929 Other chronic pain: Secondary | ICD-10-CM | POA: Diagnosis not present

## 2017-03-22 DIAGNOSIS — G4700 Insomnia, unspecified: Secondary | ICD-10-CM | POA: Diagnosis not present

## 2017-03-22 DIAGNOSIS — K5900 Constipation, unspecified: Secondary | ICD-10-CM | POA: Diagnosis not present

## 2017-03-22 DIAGNOSIS — Z9981 Dependence on supplemental oxygen: Secondary | ICD-10-CM | POA: Diagnosis not present

## 2017-03-22 DIAGNOSIS — G4730 Sleep apnea, unspecified: Secondary | ICD-10-CM | POA: Diagnosis not present

## 2017-03-22 DIAGNOSIS — I2510 Atherosclerotic heart disease of native coronary artery without angina pectoris: Secondary | ICD-10-CM | POA: Diagnosis not present

## 2017-03-22 DIAGNOSIS — R1310 Dysphagia, unspecified: Secondary | ICD-10-CM | POA: Diagnosis not present

## 2017-03-22 DIAGNOSIS — Z87440 Personal history of urinary (tract) infections: Secondary | ICD-10-CM | POA: Diagnosis not present

## 2017-03-22 DIAGNOSIS — N401 Enlarged prostate with lower urinary tract symptoms: Secondary | ICD-10-CM | POA: Diagnosis not present

## 2017-03-22 DIAGNOSIS — J984 Other disorders of lung: Secondary | ICD-10-CM | POA: Diagnosis not present

## 2017-03-22 NOTE — Telephone Encounter (Signed)
Home Health called concerning Trazadone- pt wants something different. Also pt has lump on back of leg that IS NOT red, raised or warm. And Ear is painful and wants a gel cushion for butt. [facility] Dr Yetta BarreJones would not prescribe anything stronger than trazadone due to fall risk, we would be glad to take a look at the leg and the ear. Family would need to call and sched appt for that. As far as gel cushion- ortho would need to prescribe so they could submit documentation.

## 2017-03-27 DIAGNOSIS — I69354 Hemiplegia and hemiparesis following cerebral infarction affecting left non-dominant side: Secondary | ICD-10-CM | POA: Diagnosis not present

## 2017-03-27 DIAGNOSIS — G4730 Sleep apnea, unspecified: Secondary | ICD-10-CM | POA: Diagnosis not present

## 2017-03-27 DIAGNOSIS — N401 Enlarged prostate with lower urinary tract symptoms: Secondary | ICD-10-CM | POA: Diagnosis not present

## 2017-03-27 DIAGNOSIS — R338 Other retention of urine: Secondary | ICD-10-CM | POA: Diagnosis not present

## 2017-03-27 DIAGNOSIS — G4700 Insomnia, unspecified: Secondary | ICD-10-CM | POA: Diagnosis not present

## 2017-03-27 DIAGNOSIS — G8929 Other chronic pain: Secondary | ICD-10-CM | POA: Diagnosis not present

## 2017-03-27 DIAGNOSIS — Z9981 Dependence on supplemental oxygen: Secondary | ICD-10-CM | POA: Diagnosis not present

## 2017-03-27 DIAGNOSIS — R1310 Dysphagia, unspecified: Secondary | ICD-10-CM | POA: Diagnosis not present

## 2017-03-27 DIAGNOSIS — J984 Other disorders of lung: Secondary | ICD-10-CM | POA: Diagnosis not present

## 2017-03-27 DIAGNOSIS — N481 Balanitis: Secondary | ICD-10-CM | POA: Diagnosis not present

## 2017-03-27 DIAGNOSIS — K5900 Constipation, unspecified: Secondary | ICD-10-CM | POA: Diagnosis not present

## 2017-03-27 DIAGNOSIS — Z87440 Personal history of urinary (tract) infections: Secondary | ICD-10-CM | POA: Diagnosis not present

## 2017-03-27 DIAGNOSIS — I2510 Atherosclerotic heart disease of native coronary artery without angina pectoris: Secondary | ICD-10-CM | POA: Diagnosis not present

## 2017-03-27 DIAGNOSIS — J449 Chronic obstructive pulmonary disease, unspecified: Secondary | ICD-10-CM | POA: Diagnosis not present

## 2017-03-28 DIAGNOSIS — G8929 Other chronic pain: Secondary | ICD-10-CM | POA: Diagnosis not present

## 2017-03-28 DIAGNOSIS — R338 Other retention of urine: Secondary | ICD-10-CM | POA: Diagnosis not present

## 2017-03-28 DIAGNOSIS — G4730 Sleep apnea, unspecified: Secondary | ICD-10-CM | POA: Diagnosis not present

## 2017-03-28 DIAGNOSIS — G4700 Insomnia, unspecified: Secondary | ICD-10-CM | POA: Diagnosis not present

## 2017-03-28 DIAGNOSIS — J449 Chronic obstructive pulmonary disease, unspecified: Secondary | ICD-10-CM | POA: Diagnosis not present

## 2017-03-28 DIAGNOSIS — Z87440 Personal history of urinary (tract) infections: Secondary | ICD-10-CM | POA: Diagnosis not present

## 2017-03-28 DIAGNOSIS — K5900 Constipation, unspecified: Secondary | ICD-10-CM | POA: Diagnosis not present

## 2017-03-28 DIAGNOSIS — R1310 Dysphagia, unspecified: Secondary | ICD-10-CM | POA: Diagnosis not present

## 2017-03-28 DIAGNOSIS — J984 Other disorders of lung: Secondary | ICD-10-CM | POA: Diagnosis not present

## 2017-03-28 DIAGNOSIS — N401 Enlarged prostate with lower urinary tract symptoms: Secondary | ICD-10-CM | POA: Diagnosis not present

## 2017-03-28 DIAGNOSIS — I2510 Atherosclerotic heart disease of native coronary artery without angina pectoris: Secondary | ICD-10-CM | POA: Diagnosis not present

## 2017-03-28 DIAGNOSIS — N481 Balanitis: Secondary | ICD-10-CM | POA: Diagnosis not present

## 2017-03-28 DIAGNOSIS — Z9981 Dependence on supplemental oxygen: Secondary | ICD-10-CM | POA: Diagnosis not present

## 2017-03-28 DIAGNOSIS — I69354 Hemiplegia and hemiparesis following cerebral infarction affecting left non-dominant side: Secondary | ICD-10-CM | POA: Diagnosis not present

## 2017-03-30 DIAGNOSIS — R531 Weakness: Secondary | ICD-10-CM | POA: Diagnosis not present

## 2017-03-30 DIAGNOSIS — Z515 Encounter for palliative care: Secondary | ICD-10-CM | POA: Diagnosis not present

## 2017-03-30 DIAGNOSIS — R1310 Dysphagia, unspecified: Secondary | ICD-10-CM | POA: Diagnosis not present

## 2017-03-30 DIAGNOSIS — K5900 Constipation, unspecified: Secondary | ICD-10-CM | POA: Diagnosis not present

## 2017-04-04 DIAGNOSIS — I2510 Atherosclerotic heart disease of native coronary artery without angina pectoris: Secondary | ICD-10-CM | POA: Diagnosis not present

## 2017-04-04 DIAGNOSIS — I69354 Hemiplegia and hemiparesis following cerebral infarction affecting left non-dominant side: Secondary | ICD-10-CM | POA: Diagnosis not present

## 2017-04-04 DIAGNOSIS — G4700 Insomnia, unspecified: Secondary | ICD-10-CM | POA: Diagnosis not present

## 2017-04-04 DIAGNOSIS — G4730 Sleep apnea, unspecified: Secondary | ICD-10-CM | POA: Diagnosis not present

## 2017-04-04 DIAGNOSIS — K5900 Constipation, unspecified: Secondary | ICD-10-CM | POA: Diagnosis not present

## 2017-04-04 DIAGNOSIS — J449 Chronic obstructive pulmonary disease, unspecified: Secondary | ICD-10-CM | POA: Diagnosis not present

## 2017-04-04 DIAGNOSIS — G8929 Other chronic pain: Secondary | ICD-10-CM | POA: Diagnosis not present

## 2017-04-04 DIAGNOSIS — N481 Balanitis: Secondary | ICD-10-CM | POA: Diagnosis not present

## 2017-04-04 DIAGNOSIS — R1310 Dysphagia, unspecified: Secondary | ICD-10-CM | POA: Diagnosis not present

## 2017-04-04 DIAGNOSIS — Z9981 Dependence on supplemental oxygen: Secondary | ICD-10-CM | POA: Diagnosis not present

## 2017-04-04 DIAGNOSIS — J984 Other disorders of lung: Secondary | ICD-10-CM | POA: Diagnosis not present

## 2017-04-04 DIAGNOSIS — R338 Other retention of urine: Secondary | ICD-10-CM | POA: Diagnosis not present

## 2017-04-04 DIAGNOSIS — Z87440 Personal history of urinary (tract) infections: Secondary | ICD-10-CM | POA: Diagnosis not present

## 2017-04-04 DIAGNOSIS — N401 Enlarged prostate with lower urinary tract symptoms: Secondary | ICD-10-CM | POA: Diagnosis not present

## 2017-04-06 DIAGNOSIS — I69354 Hemiplegia and hemiparesis following cerebral infarction affecting left non-dominant side: Secondary | ICD-10-CM | POA: Diagnosis not present

## 2017-04-06 DIAGNOSIS — K5900 Constipation, unspecified: Secondary | ICD-10-CM | POA: Diagnosis not present

## 2017-04-06 DIAGNOSIS — N401 Enlarged prostate with lower urinary tract symptoms: Secondary | ICD-10-CM | POA: Diagnosis not present

## 2017-04-06 DIAGNOSIS — G8929 Other chronic pain: Secondary | ICD-10-CM | POA: Diagnosis not present

## 2017-04-06 DIAGNOSIS — J984 Other disorders of lung: Secondary | ICD-10-CM | POA: Diagnosis not present

## 2017-04-06 DIAGNOSIS — G4700 Insomnia, unspecified: Secondary | ICD-10-CM | POA: Diagnosis not present

## 2017-04-06 DIAGNOSIS — Z9981 Dependence on supplemental oxygen: Secondary | ICD-10-CM | POA: Diagnosis not present

## 2017-04-06 DIAGNOSIS — N481 Balanitis: Secondary | ICD-10-CM | POA: Diagnosis not present

## 2017-04-06 DIAGNOSIS — Z87440 Personal history of urinary (tract) infections: Secondary | ICD-10-CM | POA: Diagnosis not present

## 2017-04-06 DIAGNOSIS — I2510 Atherosclerotic heart disease of native coronary artery without angina pectoris: Secondary | ICD-10-CM | POA: Diagnosis not present

## 2017-04-06 DIAGNOSIS — J449 Chronic obstructive pulmonary disease, unspecified: Secondary | ICD-10-CM | POA: Diagnosis not present

## 2017-04-06 DIAGNOSIS — G4730 Sleep apnea, unspecified: Secondary | ICD-10-CM | POA: Diagnosis not present

## 2017-04-06 DIAGNOSIS — R1310 Dysphagia, unspecified: Secondary | ICD-10-CM | POA: Diagnosis not present

## 2017-04-06 DIAGNOSIS — R338 Other retention of urine: Secondary | ICD-10-CM | POA: Diagnosis not present

## 2017-04-09 ENCOUNTER — Other Ambulatory Visit: Payer: Self-pay | Admitting: Family Medicine

## 2017-04-09 DIAGNOSIS — E039 Hypothyroidism, unspecified: Secondary | ICD-10-CM

## 2017-04-11 DIAGNOSIS — Z9981 Dependence on supplemental oxygen: Secondary | ICD-10-CM | POA: Diagnosis not present

## 2017-04-11 DIAGNOSIS — K5900 Constipation, unspecified: Secondary | ICD-10-CM | POA: Diagnosis not present

## 2017-04-11 DIAGNOSIS — N481 Balanitis: Secondary | ICD-10-CM | POA: Diagnosis not present

## 2017-04-11 DIAGNOSIS — I69354 Hemiplegia and hemiparesis following cerebral infarction affecting left non-dominant side: Secondary | ICD-10-CM | POA: Diagnosis not present

## 2017-04-11 DIAGNOSIS — G8929 Other chronic pain: Secondary | ICD-10-CM | POA: Diagnosis not present

## 2017-04-11 DIAGNOSIS — R338 Other retention of urine: Secondary | ICD-10-CM | POA: Diagnosis not present

## 2017-04-11 DIAGNOSIS — I2510 Atherosclerotic heart disease of native coronary artery without angina pectoris: Secondary | ICD-10-CM | POA: Diagnosis not present

## 2017-04-11 DIAGNOSIS — N401 Enlarged prostate with lower urinary tract symptoms: Secondary | ICD-10-CM | POA: Diagnosis not present

## 2017-04-11 DIAGNOSIS — G4700 Insomnia, unspecified: Secondary | ICD-10-CM | POA: Diagnosis not present

## 2017-04-11 DIAGNOSIS — Z87440 Personal history of urinary (tract) infections: Secondary | ICD-10-CM | POA: Diagnosis not present

## 2017-04-11 DIAGNOSIS — J449 Chronic obstructive pulmonary disease, unspecified: Secondary | ICD-10-CM | POA: Diagnosis not present

## 2017-04-11 DIAGNOSIS — R1310 Dysphagia, unspecified: Secondary | ICD-10-CM | POA: Diagnosis not present

## 2017-04-11 DIAGNOSIS — G4730 Sleep apnea, unspecified: Secondary | ICD-10-CM | POA: Diagnosis not present

## 2017-04-11 DIAGNOSIS — J984 Other disorders of lung: Secondary | ICD-10-CM | POA: Diagnosis not present

## 2017-04-13 DIAGNOSIS — Z87440 Personal history of urinary (tract) infections: Secondary | ICD-10-CM | POA: Diagnosis not present

## 2017-04-13 DIAGNOSIS — J449 Chronic obstructive pulmonary disease, unspecified: Secondary | ICD-10-CM | POA: Diagnosis not present

## 2017-04-13 DIAGNOSIS — N481 Balanitis: Secondary | ICD-10-CM | POA: Diagnosis not present

## 2017-04-13 DIAGNOSIS — I69354 Hemiplegia and hemiparesis following cerebral infarction affecting left non-dominant side: Secondary | ICD-10-CM | POA: Diagnosis not present

## 2017-04-13 DIAGNOSIS — G8929 Other chronic pain: Secondary | ICD-10-CM | POA: Diagnosis not present

## 2017-04-13 DIAGNOSIS — K5900 Constipation, unspecified: Secondary | ICD-10-CM | POA: Diagnosis not present

## 2017-04-13 DIAGNOSIS — J984 Other disorders of lung: Secondary | ICD-10-CM | POA: Diagnosis not present

## 2017-04-13 DIAGNOSIS — Z9981 Dependence on supplemental oxygen: Secondary | ICD-10-CM | POA: Diagnosis not present

## 2017-04-13 DIAGNOSIS — R338 Other retention of urine: Secondary | ICD-10-CM | POA: Diagnosis not present

## 2017-04-13 DIAGNOSIS — G4700 Insomnia, unspecified: Secondary | ICD-10-CM | POA: Diagnosis not present

## 2017-04-13 DIAGNOSIS — R1310 Dysphagia, unspecified: Secondary | ICD-10-CM | POA: Diagnosis not present

## 2017-04-13 DIAGNOSIS — I2510 Atherosclerotic heart disease of native coronary artery without angina pectoris: Secondary | ICD-10-CM | POA: Diagnosis not present

## 2017-04-13 DIAGNOSIS — N401 Enlarged prostate with lower urinary tract symptoms: Secondary | ICD-10-CM | POA: Diagnosis not present

## 2017-04-13 DIAGNOSIS — G4730 Sleep apnea, unspecified: Secondary | ICD-10-CM | POA: Diagnosis not present

## 2017-04-14 DIAGNOSIS — R0902 Hypoxemia: Secondary | ICD-10-CM | POA: Diagnosis not present

## 2017-04-14 DIAGNOSIS — G4733 Obstructive sleep apnea (adult) (pediatric): Secondary | ICD-10-CM | POA: Diagnosis not present

## 2017-04-14 DIAGNOSIS — I2510 Atherosclerotic heart disease of native coronary artery without angina pectoris: Secondary | ICD-10-CM | POA: Diagnosis not present

## 2017-04-14 DIAGNOSIS — R0609 Other forms of dyspnea: Secondary | ICD-10-CM | POA: Diagnosis not present

## 2017-04-18 DIAGNOSIS — Z87440 Personal history of urinary (tract) infections: Secondary | ICD-10-CM | POA: Diagnosis not present

## 2017-04-18 DIAGNOSIS — G8929 Other chronic pain: Secondary | ICD-10-CM | POA: Diagnosis not present

## 2017-04-18 DIAGNOSIS — K5900 Constipation, unspecified: Secondary | ICD-10-CM | POA: Diagnosis not present

## 2017-04-18 DIAGNOSIS — I2510 Atherosclerotic heart disease of native coronary artery without angina pectoris: Secondary | ICD-10-CM | POA: Diagnosis not present

## 2017-04-18 DIAGNOSIS — E038 Other specified hypothyroidism: Secondary | ICD-10-CM | POA: Diagnosis not present

## 2017-04-18 DIAGNOSIS — J984 Other disorders of lung: Secondary | ICD-10-CM | POA: Diagnosis not present

## 2017-04-18 DIAGNOSIS — G4700 Insomnia, unspecified: Secondary | ICD-10-CM | POA: Diagnosis not present

## 2017-04-18 DIAGNOSIS — J449 Chronic obstructive pulmonary disease, unspecified: Secondary | ICD-10-CM | POA: Diagnosis not present

## 2017-04-18 DIAGNOSIS — I69354 Hemiplegia and hemiparesis following cerebral infarction affecting left non-dominant side: Secondary | ICD-10-CM | POA: Diagnosis not present

## 2017-04-18 DIAGNOSIS — Z515 Encounter for palliative care: Secondary | ICD-10-CM | POA: Diagnosis not present

## 2017-04-18 DIAGNOSIS — R338 Other retention of urine: Secondary | ICD-10-CM | POA: Diagnosis not present

## 2017-04-18 DIAGNOSIS — R1310 Dysphagia, unspecified: Secondary | ICD-10-CM | POA: Diagnosis not present

## 2017-04-18 DIAGNOSIS — G4730 Sleep apnea, unspecified: Secondary | ICD-10-CM | POA: Diagnosis not present

## 2017-04-18 DIAGNOSIS — N401 Enlarged prostate with lower urinary tract symptoms: Secondary | ICD-10-CM | POA: Diagnosis not present

## 2017-04-18 DIAGNOSIS — N481 Balanitis: Secondary | ICD-10-CM | POA: Diagnosis not present

## 2017-04-18 DIAGNOSIS — Z9981 Dependence on supplemental oxygen: Secondary | ICD-10-CM | POA: Diagnosis not present

## 2017-04-20 DIAGNOSIS — J984 Other disorders of lung: Secondary | ICD-10-CM | POA: Diagnosis not present

## 2017-04-20 DIAGNOSIS — Z87440 Personal history of urinary (tract) infections: Secondary | ICD-10-CM | POA: Diagnosis not present

## 2017-04-20 DIAGNOSIS — I2510 Atherosclerotic heart disease of native coronary artery without angina pectoris: Secondary | ICD-10-CM | POA: Diagnosis not present

## 2017-04-20 DIAGNOSIS — N481 Balanitis: Secondary | ICD-10-CM | POA: Diagnosis not present

## 2017-04-20 DIAGNOSIS — R338 Other retention of urine: Secondary | ICD-10-CM | POA: Diagnosis not present

## 2017-04-20 DIAGNOSIS — G4730 Sleep apnea, unspecified: Secondary | ICD-10-CM | POA: Diagnosis not present

## 2017-04-20 DIAGNOSIS — R1310 Dysphagia, unspecified: Secondary | ICD-10-CM | POA: Diagnosis not present

## 2017-04-20 DIAGNOSIS — K5900 Constipation, unspecified: Secondary | ICD-10-CM | POA: Diagnosis not present

## 2017-04-20 DIAGNOSIS — J449 Chronic obstructive pulmonary disease, unspecified: Secondary | ICD-10-CM | POA: Diagnosis not present

## 2017-04-20 DIAGNOSIS — I69354 Hemiplegia and hemiparesis following cerebral infarction affecting left non-dominant side: Secondary | ICD-10-CM | POA: Diagnosis not present

## 2017-04-20 DIAGNOSIS — G8929 Other chronic pain: Secondary | ICD-10-CM | POA: Diagnosis not present

## 2017-04-20 DIAGNOSIS — Z9981 Dependence on supplemental oxygen: Secondary | ICD-10-CM | POA: Diagnosis not present

## 2017-04-20 DIAGNOSIS — N401 Enlarged prostate with lower urinary tract symptoms: Secondary | ICD-10-CM | POA: Diagnosis not present

## 2017-04-20 DIAGNOSIS — G4700 Insomnia, unspecified: Secondary | ICD-10-CM | POA: Diagnosis not present

## 2017-04-23 DIAGNOSIS — J449 Chronic obstructive pulmonary disease, unspecified: Secondary | ICD-10-CM | POA: Diagnosis not present

## 2017-04-23 DIAGNOSIS — N401 Enlarged prostate with lower urinary tract symptoms: Secondary | ICD-10-CM | POA: Diagnosis not present

## 2017-04-23 DIAGNOSIS — R531 Weakness: Secondary | ICD-10-CM | POA: Diagnosis not present

## 2017-05-11 ENCOUNTER — Other Ambulatory Visit: Payer: Self-pay | Admitting: Family Medicine

## 2017-05-11 DIAGNOSIS — E039 Hypothyroidism, unspecified: Secondary | ICD-10-CM

## 2017-05-14 DIAGNOSIS — R0902 Hypoxemia: Secondary | ICD-10-CM | POA: Diagnosis not present

## 2017-05-14 DIAGNOSIS — G4733 Obstructive sleep apnea (adult) (pediatric): Secondary | ICD-10-CM | POA: Diagnosis not present

## 2017-05-14 DIAGNOSIS — I2510 Atherosclerotic heart disease of native coronary artery without angina pectoris: Secondary | ICD-10-CM | POA: Diagnosis not present

## 2017-05-14 DIAGNOSIS — R0609 Other forms of dyspnea: Secondary | ICD-10-CM | POA: Diagnosis not present

## 2017-10-20 DEATH — deceased
